# Patient Record
Sex: Female | Born: 1960 | Race: White | Hispanic: No | Marital: Married | Smoking: Never smoker
Health system: Southern US, Community
[De-identification: ages and names within clinical notes are randomized; demographics above are authoritative.]

## PROBLEM LIST (undated history)

## (undated) DIAGNOSIS — I10 Essential (primary) hypertension: Secondary | ICD-10-CM

## (undated) DIAGNOSIS — B351 Tinea unguium: Secondary | ICD-10-CM

## (undated) DIAGNOSIS — T7840XA Allergy, unspecified, initial encounter: Secondary | ICD-10-CM

## (undated) DIAGNOSIS — E785 Hyperlipidemia, unspecified: Secondary | ICD-10-CM

## (undated) DIAGNOSIS — F419 Anxiety disorder, unspecified: Secondary | ICD-10-CM

## (undated) DIAGNOSIS — E669 Obesity, unspecified: Secondary | ICD-10-CM

## (undated) DIAGNOSIS — G4730 Sleep apnea, unspecified: Secondary | ICD-10-CM

## (undated) DIAGNOSIS — M25579 Pain in unspecified ankle and joints of unspecified foot: Secondary | ICD-10-CM

## (undated) DIAGNOSIS — K219 Gastro-esophageal reflux disease without esophagitis: Secondary | ICD-10-CM

## (undated) DIAGNOSIS — E559 Vitamin D deficiency, unspecified: Secondary | ICD-10-CM

## (undated) DIAGNOSIS — R0602 Shortness of breath: Secondary | ICD-10-CM

## (undated) DIAGNOSIS — M8580 Other specified disorders of bone density and structure, unspecified site: Secondary | ICD-10-CM

## (undated) DIAGNOSIS — M779 Enthesopathy, unspecified: Secondary | ICD-10-CM

## (undated) DIAGNOSIS — H269 Unspecified cataract: Secondary | ICD-10-CM

## (undated) DIAGNOSIS — M1990 Unspecified osteoarthritis, unspecified site: Secondary | ICD-10-CM

## (undated) DIAGNOSIS — M2550 Pain in unspecified joint: Secondary | ICD-10-CM

## (undated) DIAGNOSIS — F32A Depression, unspecified: Secondary | ICD-10-CM

## (undated) DIAGNOSIS — N920 Excessive and frequent menstruation with regular cycle: Secondary | ICD-10-CM

## (undated) DIAGNOSIS — R600 Localized edema: Secondary | ICD-10-CM

## (undated) HISTORY — DX: Tinea unguium: B35.1

## (undated) HISTORY — DX: Vitamin D deficiency, unspecified: E55.9

## (undated) HISTORY — PX: TONSILLECTOMY: SUR1361

## (undated) HISTORY — DX: Depression, unspecified: F32.A

## (undated) HISTORY — DX: Unspecified cataract: H26.9

## (undated) HISTORY — DX: Localized edema: R60.0

## (undated) HISTORY — DX: Allergy, unspecified, initial encounter: T78.40XA

## (undated) HISTORY — PX: APPENDECTOMY: SHX54

## (undated) HISTORY — DX: Essential (primary) hypertension: I10

## (undated) HISTORY — DX: Unspecified osteoarthritis, unspecified site: M19.90

## (undated) HISTORY — DX: Hyperlipidemia, unspecified: E78.5

## (undated) HISTORY — DX: Other specified disorders of bone density and structure, unspecified site: M85.80

## (undated) HISTORY — DX: Gastro-esophageal reflux disease without esophagitis: K21.9

## (undated) HISTORY — DX: Anxiety disorder, unspecified: F41.9

## (undated) HISTORY — DX: Pain in unspecified ankle and joints of unspecified foot: M25.579

## (undated) HISTORY — DX: Obesity, unspecified: E66.9

## (undated) HISTORY — DX: Shortness of breath: R06.02

## (undated) HISTORY — DX: Enthesopathy, unspecified: M77.9

## (undated) HISTORY — DX: Excessive and frequent menstruation with regular cycle: N92.0

## (undated) HISTORY — DX: Sleep apnea, unspecified: G47.30

## (undated) HISTORY — DX: Pain in unspecified joint: M25.50

---

## 1998-01-23 ENCOUNTER — Other Ambulatory Visit: Admission: RE | Admit: 1998-01-23 | Discharge: 1998-01-23 | Payer: Self-pay | Admitting: Dermatology

## 1998-07-21 ENCOUNTER — Other Ambulatory Visit: Admission: RE | Admit: 1998-07-21 | Discharge: 1998-07-21 | Payer: Self-pay | Admitting: Obstetrics and Gynecology

## 1999-09-27 ENCOUNTER — Other Ambulatory Visit: Admission: RE | Admit: 1999-09-27 | Discharge: 1999-09-27 | Payer: Self-pay | Admitting: Obstetrics and Gynecology

## 2000-12-21 ENCOUNTER — Other Ambulatory Visit: Admission: RE | Admit: 2000-12-21 | Discharge: 2000-12-21 | Payer: Self-pay | Admitting: Obstetrics and Gynecology

## 2002-03-19 ENCOUNTER — Other Ambulatory Visit: Admission: RE | Admit: 2002-03-19 | Discharge: 2002-03-19 | Payer: Self-pay | Admitting: Obstetrics and Gynecology

## 2003-03-20 DIAGNOSIS — E782 Mixed hyperlipidemia: Secondary | ICD-10-CM | POA: Insufficient documentation

## 2003-04-16 ENCOUNTER — Other Ambulatory Visit: Admission: RE | Admit: 2003-04-16 | Discharge: 2003-04-16 | Payer: Self-pay | Admitting: Obstetrics and Gynecology

## 2005-01-21 ENCOUNTER — Encounter: Admission: RE | Admit: 2005-01-21 | Discharge: 2005-01-21 | Payer: Self-pay | Admitting: Obstetrics and Gynecology

## 2006-03-13 ENCOUNTER — Encounter: Admission: RE | Admit: 2006-03-13 | Discharge: 2006-03-13 | Payer: Self-pay | Admitting: Obstetrics and Gynecology

## 2007-04-02 ENCOUNTER — Encounter: Admission: RE | Admit: 2007-04-02 | Discharge: 2007-04-02 | Payer: Self-pay | Admitting: Obstetrics and Gynecology

## 2008-04-25 ENCOUNTER — Encounter: Admission: RE | Admit: 2008-04-25 | Discharge: 2008-04-25 | Payer: Self-pay | Admitting: Obstetrics and Gynecology

## 2009-09-19 LAB — HM DEXA SCAN

## 2010-09-19 HISTORY — PX: COLONOSCOPY: SHX174

## 2010-10-10 ENCOUNTER — Encounter: Payer: Self-pay | Admitting: Obstetrics and Gynecology

## 2010-10-11 ENCOUNTER — Encounter: Payer: Self-pay | Admitting: Obstetrics and Gynecology

## 2010-11-29 ENCOUNTER — Encounter (INDEPENDENT_AMBULATORY_CARE_PROVIDER_SITE_OTHER): Payer: Self-pay | Admitting: *Deleted

## 2010-12-07 NOTE — Letter (Signed)
Summary: Pre Visit Letter Revised  [facility]  [address] [place], [place] [address]   Phone: [phone]  Fax: [phone]        11/29/2010 MRN: [id number] [facility] [address] [place], [place]  [address]             Procedure Date:  December 29, 2010   dir col -Dr Jarold Motto   Welcome to the Gastroenterology Division at [facility].    You are scheduled to see a nurse for your pre-procedure visit on December 15, 2010 at 4:00pm on the 3rd floor at [facility], [address]  We ask that you try to arrive at our office 15 minutes prior to your appointment time to allow for check-in.  Please take a minute to review the attached form.  If you answer "Yes" to one or more of the questions on the first page, we ask that you call the person listed at your earliest opportunity.  If you answer "No" to all of the questions, please complete the rest of the form and bring it to your appointment.    Your nurse visit will consist of discussing your medical and surgical history, your immediate family medical history, and your medications.   If you are unable to list all of your medications on the form, please bring the medication bottles to your appointment and we will list them.  We will need to be aware of both prescribed and over the counter drugs.  We will need to know exact dosage information as well.    Please be prepared to read and sign documents such as consent forms, a financial agreement, and acknowledgement forms.  If necessary, and with your consent, a friend or relative is welcome to sit-in on the nurse visit with you.  Please bring your insurance card so that we may make a copy of it.  If your insurance requires a referral to see a specialist, please bring your referral form from your primary care physician.  No co-pay is required for this nurse visit.     If you cannot keep your appointment, please call [phone] to cancel or reschedule prior  to your appointment date.  This allows [place] the opportunity to schedule an appointment for another patient in need of care.    Thank you for choosing [facility] for your medical needs.  We appreciate the opportunity to care for you.  Please visit [place] at our website  to learn more about our practice.  Sincerely, The Gastroenterology Division

## 2010-12-29 ENCOUNTER — Other Ambulatory Visit: Payer: Self-pay | Admitting: Gastroenterology

## 2012-12-21 ENCOUNTER — Other Ambulatory Visit: Payer: Self-pay | Admitting: Family Medicine

## 2012-12-21 NOTE — Telephone Encounter (Signed)
Denied! This was given to the patient on 06/21/13 with 11 refills. PG

## 2012-12-22 ENCOUNTER — Other Ambulatory Visit: Payer: Self-pay | Admitting: Family Medicine

## 2013-04-16 ENCOUNTER — Encounter: Payer: Self-pay | Admitting: Family Medicine

## 2013-04-16 ENCOUNTER — Ambulatory Visit (INDEPENDENT_AMBULATORY_CARE_PROVIDER_SITE_OTHER): Payer: BC Managed Care – PPO | Admitting: Family Medicine

## 2013-04-16 VITALS — BP 124/84 | HR 83 | Resp 16 | Ht 64.0 in | Wt 222.0 lb

## 2013-04-16 DIAGNOSIS — R232 Flushing: Secondary | ICD-10-CM

## 2013-04-16 DIAGNOSIS — N924 Excessive bleeding in the premenopausal period: Secondary | ICD-10-CM

## 2013-04-16 MED ORDER — NORETHINDRONE 0.35 MG PO TABS: 1.0000 | ORAL_TABLET | Freq: Every day | ORAL | Status: DC

## 2013-04-16 MED ORDER — NORETHIN ACE-ETH ESTRAD-FE 1-20 MG-MCG(24) PO CHEW: 1.0000 | CHEWABLE_TABLET | Freq: Every day | ORAL | Status: DC

## 2013-04-16 NOTE — Progress Notes (Signed)
  Subjective:    Patient ID: Emily Stephens, female    DOB: 17-Apr-1961, 52 y.o.   MRN: [id number]  HPI  Emily Stephens is here today to discuss her menstrual cycle.  She has been experiencing heavy bleeding during her menstrual cycle for the past year.  This problem seems to change from month to month.  She has seen Dr.  Arelia Sneddon (OB/GYN) for this problem.  She was given birth control pills which really has not improved her bleeding.  Review of Systems  Constitutional: Positive for fatigue and unexpected weight change.  Respiratory: Negative for shortness of breath.   Cardiovascular: Negative for chest pain, palpitations and leg swelling.  Genitourinary: Positive for menstrual problem.  Hematological: Does not bruise/bleed easily.    Past Medical History  Diagnosis Date  . Hyperlipidemia   . Obesity   . Osteopenia   . Menorrhagia   . Allergy   . Bone spur     Right Foot  . Other and unspecified hyperlipidemia   . Essential hypertension, benign   . Unspecified vitamin D deficiency   . Pain in joint, ankle and foot   . Dermatophytosis of nail     Family History  Problem Relation Age of Onset  . Lung disease Mother   . Osteoporosis Mother   . CVA Father   . Aneurysm Sister   . Heart disease Brother   . Diabetes Maternal Aunt   . Cancer Maternal Aunt     Breast Cancer   . 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 Constitutional: She is oriented to person, place, and time.  Eyes: Conjunctivae are normal. No  scleral icterus.  Neck: Neck supple. No thyromegaly present.  Cardiovascular: Normal rate, regular rhythm and normal heart sounds.   Pulmonary/Chest: Effort normal and breath sounds normal.  Musculoskeletal: She exhibits no edema and no tenderness.  Lymphadenopathy:    She has no cervical adenopathy.  Neurological: She is alert and oriented to person, place, and time.  Skin: Skin is warm and dry.  Psychiatric: She has a normal mood and affect. Her behavior is normal. Judgment and thought content normal.          Assessment & Plan:

## 2013-04-16 NOTE — Patient Instructions (Addendum)
1)  Excessive Bleeding - Try the Micronor as we discussed.  If this does not accomplish what we want then try the Minastrin.    2)  Night Sweats/Flushing - If this worsens then you an start with an OTC supplement called Estrovan.    Perimenopause Perimenopause is the time when your body begins to move into the menopause (no menstrual period for 12 straight months). It is a natural process. Perimenopause can begin 2 to 8 years before the menopause and usually lasts for one year after the menopause. During this time, your ovaries may or may not produce an egg. The ovaries vary in their production of estrogen and progesterone hormones each month. This can cause irregular menstrual periods, difficulty in getting pregnant, vaginal bleeding between periods and uncomfortable symptoms. CAUSES  Irregular production of the ovarian hormones, estrogen and progesterone, and not ovulating every month.  Other causes include:  Tumor of the pituitary gland in the brain.  Medical disease that affects the ovaries.  Radiation treatment.  Chemotherapy.  Unknown causes.  Heavy smoking and excessive alcohol intake can bring on perimenopause sooner. SYMPTOMS   Hot flashes.  Night sweats.  Irregular menstrual periods.  Decrease sex drive.  Vaginal dryness.  Headaches.  Mood swings.  Depression.  Memory problems.  Irritability.  Tiredness.  Weight gain.  Trouble getting pregnant.  The beginning of losing bone cells (osteoporosis).  The beginning of hardening of the arteries (atherosclerosis). DIAGNOSIS  Your caregiver will make a diagnosis by analyzing your age, menstrual history and your symptoms. They will do a physical exam noting any changes in your body, especially your female organs. Female hormone tests may or may not be helpful depending on the amount and when you produce the female hormones. However, other hormone tests may be helpful (ex. thyroid hormone) to rule out other  problems. TREATMENT  The decision to treat during the perimenopause should be made by you and your caregiver depending on how the symptoms are affecting you and your life style. There are various treatments available such as:  Treating individual symptoms with a specific medication for that symptom (ex. tranquilizer for depression).  Herbal medications that can help specific symptoms.  Counseling.  Group therapy.  No treatment. HOME CARE INSTRUCTIONS   Before seeing your caregiver, make a list of your menstrual periods (when the occur, how heavy they are, how long between periods and how long they last), your symptoms and when they started.  Take the medication as recommended by your caregiver.  Sleep and rest.  Exercise.  Eat a diet that contains calcium (good for your bones) and soy (acts like estrogen hormone).  Do not smoke.  Avoid alcoholic beverages.  Taking vitamin E may help in certain cases.  Take calcium and vitamin D supplements to help prevent bone loss.  Group therapy is sometimes helpful.  Acupuncture may help in some cases. SEEK MEDICAL CARE IF:   You have any of the above and want to know if it is perimenopause.  You want advice and treatment for any of your symptoms mentioned above.  You need a referral to a specialist (gynecologist, psychiatrist or psychologist). SEEK IMMEDIATE MEDICAL CARE IF:   You have vaginal bleeding.  Your period lasts longer than 8 days.  You periods are recurring sooner than 21 days.  You have bleeding after intercourse.  You have severe depression.  You have pain when you urinate.  You have severe headaches.  You develop vision problems. Document Released: 10/13/2004  Document Revised: 11/28/2011 Document Reviewed: 07/03/2008 [facility] Patient Information 2014 [place], [place].

## 2013-05-26 ENCOUNTER — Encounter: Payer: Self-pay | Admitting: Family Medicine

## 2013-05-26 DIAGNOSIS — N924 Excessive bleeding in the premenopausal period: Secondary | ICD-10-CM | POA: Insufficient documentation

## 2013-05-26 DIAGNOSIS — R232 Flushing: Secondary | ICD-10-CM | POA: Insufficient documentation

## 2013-05-26 NOTE — Assessment & Plan Note (Signed)
She is going to try Micronor vs Minastrin.

## 2013-05-26 NOTE — Assessment & Plan Note (Signed)
She is to try some Estrovan for her hot flashes.

## 2013-07-25 ENCOUNTER — Other Ambulatory Visit: Payer: Self-pay

## 2013-10-14 ENCOUNTER — Other Ambulatory Visit: Payer: Self-pay | Admitting: Family Medicine

## 2013-11-11 ENCOUNTER — Ambulatory Visit (INDEPENDENT_AMBULATORY_CARE_PROVIDER_SITE_OTHER): Payer: BC Managed Care – PPO | Admitting: Family Medicine

## 2013-11-11 ENCOUNTER — Encounter: Payer: Self-pay | Admitting: Family Medicine

## 2013-11-11 VITALS — BP 143/83 | HR 96 | Resp 16 | Ht 62.0 in | Wt 229.0 lb

## 2013-11-11 DIAGNOSIS — M5430 Sciatica, unspecified side: Secondary | ICD-10-CM

## 2013-11-11 DIAGNOSIS — M545 Low back pain, unspecified: Secondary | ICD-10-CM

## 2013-11-11 DIAGNOSIS — M5432 Sciatica, left side: Secondary | ICD-10-CM

## 2013-11-11 DIAGNOSIS — R12 Heartburn: Secondary | ICD-10-CM

## 2013-11-11 LAB — POCT URINALYSIS DIPSTICK
Bilirubin, UA: NEGATIVE
Blood, UA: NEGATIVE
Glucose, UA: NEGATIVE
Ketones, UA: NEGATIVE
Leukocytes, UA: NEGATIVE
Nitrite, UA: NEGATIVE
Protein, UA: NEGATIVE
Spec Grav, UA: >=1.03
Urobilinogen, UA: NEGATIVE
pH, UA: 5

## 2013-11-11 MED ORDER — PANTOPRAZOLE SODIUM 40 MG PO TBEC: 40.0000 mg | DELAYED_RELEASE_TABLET | Freq: Every day | ORAL | Status: DC

## 2013-11-11 MED ORDER — GABAPENTIN 300 MG PO CAPS: ORAL_CAPSULE | ORAL | Status: DC

## 2013-11-11 MED ORDER — CYCLOBENZAPRINE HCL 5 MG PO TABS: ORAL_TABLET | ORAL | Status: AC

## 2013-11-11 MED ORDER — METHYLPREDNISOLONE SODIUM SUCC 125 MG IJ SOLR
125.0000 mg | Freq: Once | INTRAMUSCULAR | Status: AC
  Administered 2013-11-11: 125 mg via INTRAMUSCULAR

## 2013-11-11 MED ORDER — TRAMADOL-ACETAMINOPHEN 37.5-325 MG PO TABS: 1.0000 | ORAL_TABLET | Freq: Four times a day (QID) | ORAL | Status: DC | PRN

## 2013-11-11 MED ORDER — CELECOXIB 200 MG PO CAPS: 200.0000 mg | ORAL_CAPSULE | Freq: Two times a day (BID) | ORAL | Status: AC

## 2013-11-11 NOTE — Patient Instructions (Signed)
1)  Back Pain - You received a steroid injection at today's visit.  Take Celebrex 1-2 capsules daily for inflammation/pain along with Tylenol 1000 mg and 1-2 of the Flexeril (muscle relaxer) at night.  At this point if you aren't better consider a chiropractic adjustment.  You may also add some Neurontin 300 mg - Start with 1 at night and slowly increase to 2 per day then 3 per day if needed. You can also add some Ultracet 1-2 at night and increase to 2 x per day if needed.  If you take the Ultracet, remember to decrease your Tylenol because Ultracet has some Tylenol (max Tylenol is 3000 mg per day).  Hot bath with epsom salt and back exercises.      Sciatica with Rehab The sciatic nerve runs from the back down the leg and is responsible for sensation and control of the muscles in the back (posterior) side of the thigh, lower leg, and foot. Sciatica is a condition that is characterized by inflammation of this nerve.  SYMPTOMS   Signs of nerve damage, including numbness and/or weakness along the posterior side of the lower extremity.  Pain in the back of the thigh that may also travel down the leg.  Pain that worsens when sitting for long periods of time.  Occasionally, pain in the back or buttock. CAUSES  Inflammation of the sciatic nerve is the cause of sciatica. The inflammation is due to something irritating the nerve. Common sources of irritation include:  Sitting for long periods of time.  Direct trauma to the nerve.  Arthritis of the spine.  Herniated or ruptured disk.  Slipping of the vertebrae (spondylolithesis)  Pressure from soft tissues, such as muscles or ligament-like tissue (fascia). RISK INCREASES WITH:  Sports that place pressure or stress on the spine (football or weightlifting).  Poor strength and flexibility.  Failure to warm-up properly before activity.  Family history of low back pain or disk disorders.  Previous back injury or surgery.  Poor body  mechanics, especially when lifting, or poor posture. PREVENTION   Warm up and stretch properly before activity.  Maintain physical fitness:  Strength, flexibility, and endurance.  Cardiovascular fitness.  Learn and use proper technique, especially with posture and lifting. When possible, have coach correct improper technique.  Avoid activities that place stress on the spine. PROGNOSIS If treated properly, then sciatica usually resolves within 6 weeks. However, occasionally surgery is necessary.  RELATED COMPLICATIONS   Permanent nerve damage, including pain, numbness, tingle, or weakness.  Chronic back pain.  Risks of surgery: infection, bleeding, nerve damage, or damage to surrounding tissues. TREATMENT Treatment initially involves resting from any activities that aggravate your symptoms. The use of ice and medication may help reduce pain and inflammation. The use of strengthening and stretching exercises may help reduce pain with activity. These exercises may be performed at home or with referral to a therapist. A therapist may recommend further treatments, such as transcutaneous electronic nerve stimulation (TENS) or ultrasound. Your caregiver may recommend corticosteroid injections to help reduce inflammation of the sciatic nerve. If symptoms persist despite non-surgical (conservative) treatment, then surgery may be recommended. MEDICATION  If pain medication is necessary, then nonsteroidal anti-inflammatory medications, such as aspirin and ibuprofen, or other minor pain relievers, such as acetaminophen, are often recommended.  Do not take pain medication for 7 days before surgery.  Prescription pain relievers may be given if deemed necessary by your caregiver. Use only as directed and only as much as  you need.  Ointments applied to the skin may be helpful.  Corticosteroid injections may be given by your caregiver. These injections should be reserved for the most serious cases,  because they may only be given a certain number of times. HEAT AND COLD  Cold treatment (icing) relieves pain and reduces inflammation. Cold treatment should be applied for 10 to 15 minutes every 2 to 3 hours for inflammation and pain and immediately after any activity that aggravates your symptoms. Use ice packs or massage the area with a piece of ice (ice massage).  Heat treatment may be used prior to performing the stretching and strengthening activities prescribed by your caregiver, physical therapist, or athletic trainer. Use a heat pack or soak the injury in warm water. SEEK MEDICAL CARE IF:  Treatment seems to offer no benefit, or the condition worsens.  Any medications produce adverse side effects. EXERCISES  RANGE OF MOTION (ROM) AND STRETCHING EXERCISES - Sciatica Most people with sciatic will find that their symptoms worsen with either excessive bending forward (flexion) or arching at the low back (extension). The exercises which will help resolve your symptoms will focus on the opposite motion. Your physician, physical therapist or athletic trainer will help you determine which exercises will be most helpful to resolve your low back pain. Do not complete any exercises without first consulting with your clinician. Discontinue any exercises which worsen your symptoms until you speak to your clinician. If you have pain, numbness or tingling which travels down into your buttocks, leg or foot, the goal of the therapy is for these symptoms to move closer to your back and eventually resolve. Occasionally, these leg symptoms will get better, but your low back pain may worsen; this is typically an indication of progress in your rehabilitation. Be certain to be very alert to any changes in your symptoms and the activities in which you participated in the 24 hours prior to the change. Sharing this information with your clinician will allow him/her to most efficiently treat your condition. These  exercises may help you when beginning to rehabilitate your injury. Your symptoms may resolve with or without further involvement from your physician, physical therapist or athletic trainer. While completing these exercises, remember:   Restoring tissue flexibility helps normal motion to return to the joints. This allows healthier, less painful movement and activity.  An effective stretch should be held for at least 30 seconds.  A stretch should never be painful. You should only feel a gentle lengthening or release in the stretched tissue. FLEXION RANGE OF MOTION AND STRETCHING EXERCISES: STRETCH  Flexion, Single Knee to Chest   Lie on a firm bed or floor with both legs extended in front of you.  Keeping one leg in contact with the floor, bring your opposite knee to your chest. Hold your leg in place by either grabbing behind your thigh or at your knee.  Pull until you feel a gentle stretch in your low back. Hold __________ seconds.  Slowly release your grasp and repeat the exercise with the opposite side. Repeat __________ times. Complete this exercise __________ times per day.  STRETCH  Flexion, Double Knee to Chest  Lie on a firm bed or floor with both legs extended in front of you.  Keeping one leg in contact with the floor, bring your opposite knee to your chest.  Tense your stomach muscles to support your back and then lift your other knee to your chest. Hold your legs in place by either grabbing  behind your thighs or at your knees.  Pull both knees toward your chest until you feel a gentle stretch in your low back. Hold __________ seconds.  Tense your stomach muscles and slowly return one leg at a time to the floor. Repeat __________ times. Complete this exercise __________ times per day.  STRETCH  Low Trunk Rotation   Lie on a firm bed or floor. Keeping your legs in front of you, bend your knees so they are both pointed toward the ceiling and your feet are flat on the  floor.  Extend your arms out to the side. This will stabilize your upper body by keeping your shoulders in contact with the floor.  Gently and slowly drop both knees together to one side until you feel a gentle stretch in your low back. Hold for __________ seconds.  Tense your stomach muscles to support your low back as you bring your knees back to the starting position. Repeat the exercise to the other side. Repeat __________ times. Complete this exercise __________ times per day  EXTENSION RANGE OF MOTION AND FLEXIBILITY EXERCISES: STRETCH  Extension, Prone on Elbows  Lie on your stomach on the floor, a bed will be too soft. Place your palms about shoulder width apart and at the height of your head.  Place your elbows under your shoulders. If this is too painful, stack pillows under your chest.  Allow your body to relax so that your hips drop lower and make contact more completely with the floor.  Hold this position for __________ seconds.  Slowly return to lying flat on the floor. Repeat __________ times. Complete this exercise __________ times per day.  RANGE OF MOTION  Extension, Prone Press Ups  Lie on your stomach on the floor, a bed will be too soft. Place your palms about shoulder width apart and at the height of your head.  Keeping your back as relaxed as possible, slowly straighten your elbows while keeping your hips on the floor. You may adjust the placement of your hands to maximize your comfort. As you gain motion, your hands will come more underneath your shoulders.  Hold this position __________ seconds.  Slowly return to lying flat on the floor. Repeat __________ times. Complete this exercise __________ times per day.  STRENGTHENING EXERCISES - Sciatica  These exercises may help you when beginning to rehabilitate your injury. These exercises should be done near your "sweet spot." This is the neutral, low-back arch, somewhere between fully rounded and fully arched,  that is your least painful position. When performed in this safe range of motion, these exercises can be used for people who have either a flexion or extension based injury. These exercises may resolve your symptoms with or without further involvement from your physician, physical therapist or athletic trainer. While completing these exercises, remember:   Muscles can gain both the endurance and the strength needed for everyday activities through controlled exercises.  Complete these exercises as instructed by your physician, physical therapist or athletic trainer. Progress with the resistance and repetition exercises only as your caregiver advises.  You may experience muscle soreness or fatigue, but the pain or discomfort you are trying to eliminate should never worsen during these exercises. If this pain does worsen, stop and make certain you are following the directions exactly. If the pain is still present after adjustments, discontinue the exercise until you can discuss the trouble with your clinician. STRENGTHENING Deep Abdominals, Pelvic Tilt   Lie on a firm bed or  floor. Keeping your legs in front of you, bend your knees so they are both pointed toward the ceiling and your feet are flat on the floor.  Tense your lower abdominal muscles to press your low back into the floor. This motion will rotate your pelvis so that your tail bone is scooping upwards rather than pointing at your feet or into the floor.  With a gentle tension and even breathing, hold this position for __________ seconds. Repeat __________ times. Complete this exercise __________ times per day.  STRENGTHENING  Abdominals, Crunches   Lie on a firm bed or floor. Keeping your legs in front of you, bend your knees so they are both pointed toward the ceiling and your feet are flat on the floor. Cross your arms over your chest.  Slightly tip your chin down without bending your neck.  Tense your abdominals and slowly lift your  trunk high enough to just clear your shoulder blades. Lifting higher can put excessive stress on the low back and does not further strengthen your abdominal muscles.  Control your return to the starting position. Repeat __________ times. Complete this exercise __________ times per day.  STRENGTHENING  Quadruped, Opposite UE/LE Lift  Assume a hands and knees position on a firm surface. Keep your hands under your shoulders and your knees under your hips. You may place padding under your knees for comfort.  Find your neutral spine and gently tense your abdominal muscles so that you can maintain this position. Your shoulders and hips should form a rectangle that is parallel with the floor and is not twisted.  Keeping your trunk steady, lift your right hand no higher than your shoulder and then your left leg no higher than your hip. Make sure you are not holding your breath. Hold this position __________ seconds.  Continuing to keep your abdominal muscles tense and your back steady, slowly return to your starting position. Repeat with the opposite arm and leg. Repeat __________ times. Complete this exercise __________ times per day.  STRENGTHENING  Abdominals and Quadriceps, Straight Leg Raise   Lie on a firm bed or floor with both legs extended in front of you.  Keeping one leg in contact with the floor, bend the other knee so that your foot can rest flat on the floor.  Find your neutral spine, and tense your abdominal muscles to maintain your spinal position throughout the exercise.  Slowly lift your straight leg off the floor about 6 inches for a count of 15, making sure to not hold your breath.  Still keeping your neutral spine, slowly lower your leg all the way to the floor. Repeat this exercise with each leg __________ times. Complete this exercise __________ times per day. POSTURE AND BODY MECHANICS CONSIDERATIONS - Sciatica Keeping correct posture when sitting, standing or completing  your activities will reduce the stress put on different body tissues, allowing injured tissues a chance to heal and limiting painful experiences. The following are general guidelines for improved posture. Your physician or physical therapist will provide you with any instructions specific to your needs. While reading these guidelines, remember:  The exercises prescribed by your provider will help you have the flexibility and strength to maintain correct postures.  The correct posture provides the optimal environment for your joints to work. All of your joints have less wear and tear when properly supported by a spine with good posture. This means you will experience a healthier, less painful body.  Correct posture must be practiced  with all of your activities, especially prolonged sitting and standing. Correct posture is as important when doing repetitive low-stress activities (typing) as it is when doing a single heavy-load activity (lifting). RESTING POSITIONS Consider which positions are most painful for you when choosing a resting position. If you have pain with flexion-based activities (sitting, bending, stooping, squatting), choose a position that allows you to rest in a less flexed posture. You would want to avoid curling into a fetal position on your side. If your pain worsens with extension-based activities (prolonged standing, working overhead), avoid resting in an extended position such as sleeping on your stomach. Most people will find more comfort when they rest with their spine in a more neutral position, neither too rounded nor too arched. Lying on a non-sagging bed on your side with a pillow between your knees, or on your back with a pillow under your knees will often provide some relief. Keep in mind, being in any one position for a prolonged period of time, no matter how correct your posture, can still lead to stiffness. PROPER SITTING POSTURE In order to minimize stress and discomfort on  your spine, you must sit with correct posture Sitting with good posture should be effortless for a healthy body. Returning to good posture is a gradual process. Many people can work toward this most comfortably by using various supports until they have the flexibility and strength to maintain this posture on their own. When sitting with proper posture, your ears will fall over your shoulders and your shoulders will fall over your hips. You should use the back of the chair to support your upper back. Your low back will be in a neutral position, just slightly arched. You may place a small pillow or folded towel at the base of your low back for support.  When working at a desk, create an environment that supports good, upright posture. Without extra support, muscles fatigue and lead to excessive strain on joints and other tissues. Keep these recommendations in mind: CHAIR:   A chair should be able to slide under your desk when your back makes contact with the back of the chair. This allows you to work closely.  The chair's height should allow your eyes to be level with the upper part of your monitor and your hands to be slightly lower than your elbows. BODY POSITION  Your feet should make contact with the floor. If this is not possible, use a foot rest.  Keep your ears over your shoulders. This will reduce stress on your neck and low back. INCORRECT SITTING POSTURES   If you are feeling tired and unable to assume a healthy sitting posture, do not slouch or slump. This puts excessive strain on your back tissues, causing more damage and pain. Healthier options include:  Using more support, like a lumbar pillow.  Switching tasks to something that requires you to be upright or walking.  Talking a brief walk.  Lying down to rest in a neutral-spine position. PROLONGED STANDING WHILE SLIGHTLY LEANING FORWARD  When completing a task that requires you to lean forward while standing in one place for a  long time, place either foot up on a stationary 2-4 inch high object to help maintain the best posture. When both feet are on the ground, the low back tends to lose its slight inward curve. If this curve flattens (or becomes too large), then the back and your other joints will experience too much stress, fatigue more quickly and can  cause pain.  CORRECT STANDING POSTURES Proper standing posture should be assumed with all daily activities, even if they only take a few moments, like when brushing your teeth. As in sitting, your ears should fall over your shoulders and your shoulders should fall over your hips. You should keep a slight tension in your abdominal muscles to brace your spine. Your tailbone should point down to the ground, not behind your body, resulting in an over-extended swayback posture.  INCORRECT STANDING POSTURES  Common incorrect standing postures include a forward head, locked knees and/or an excessive swayback. WALKING Walk with an upright posture. Your ears, shoulders and hips should all line-up. PROLONGED ACTIVITY IN A FLEXED POSITION When completing a task that requires you to bend forward at your waist or lean over a low surface, try to find a way to stabilize 3 of 4 of your limbs. You can place a hand or elbow on your thigh or rest a knee on the surface you are reaching across. This will provide you more stability so that your muscles do not fatigue as quickly. By keeping your knees relaxed, or slightly bent, you will also reduce stress across your low back. CORRECT LIFTING TECHNIQUES DO :   Assume a wide stance. This will provide you more stability and the opportunity to get as close as possible to the object which you are lifting.  Tense your abdominals to brace your spine; then bend at the knees and hips. Keeping your back locked in a neutral-spine position, lift using your leg muscles. Lift with your legs, keeping your back straight.  Test the weight of unknown objects  before attempting to lift them.  Try to keep your elbows locked down at your sides in order get the best strength from your shoulders when carrying an object.  Always ask for help when lifting heavy or awkward objects. INCORRECT LIFTING TECHNIQUES DO NOT:   Lock your knees when lifting, even if it is a small object.  Bend and twist. Pivot at your feet or move your feet when needing to change directions.  Assume that you cannot safely pick up a paperclip without proper posture. Document Released: 09/05/2005 Document Revised: 11/28/2011 Document Reviewed: 12/18/2008 [facility] Patient Information 2014 [place], [place].

## 2013-11-11 NOTE — Progress Notes (Signed)
Subjective:    Patient ID: Emily Stephens, female    DOB: 04/20/1961, 53 y.o.   MRN: [id number]  HPI  Chera is here today complaining of left hip and lower back pain. She says that her pain first started in left hip and over time it has moved to her lower back. She has been having this pain for several months. She has been taking Ibuprofen for her pain which helps her to get to sleep at night when the pain is the worse.  She is also complaining of heart burn. She has tried OTC Prilosec and Tums which have not helped.    Review of Systems  Constitutional: Negative for activity change, appetite change and unexpected weight change.  Cardiovascular: Negative for chest pain, palpitations and leg swelling.  Gastrointestinal:       Heart burn  Endocrine: Negative for polyuria.  Genitourinary: Negative for dysuria.  Musculoskeletal: Positive for back pain.  All other systems reviewed and are negative.    Past Medical History  Diagnosis Date  . Hyperlipidemia   . Obesity   . Osteopenia   . Menorrhagia   . Allergy   . Bone spur     Right Foot  . Other and unspecified hyperlipidemia   . Essential hypertension, benign   . Unspecified vitamin D deficiency   . Pain in joint, ankle and foot   . Dermatophytosis of nail      Past Surgical History  Procedure Laterality Date  . Appendectomy    . 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 Lung disease Mother   . Osteoporosis Mother   . CVA Father   . Aneurysm Sister     . Heart disease Brother   . Diabetes Maternal Aunt   . Cancer Maternal Aunt     Breast Cancer   . Diabetes Maternal Uncle      Current Outpatient Prescriptions on File Prior to Visit  Medication Sig Dispense Refill  . Norethin Ace-Eth Estrad-FE (MINASTRIN 24 FE) 1-20 MG-MCG(24) CHEW Chew 1 tablet by mouth daily.  28 tablet  11  . rosuvastatin (CRESTOR) 20 MG tablet Take 20 mg by mouth daily.       No current facility-administered medications on file prior to visit.     No Known Allergies   Immunization History  Administered Date(s) Administered  . Tdap 01/01/2008  . Zoster 06/21/2012       Objective:   Physical Exam  Vitals reviewed. Constitutional: She is oriented to person, place, and time.  Eyes: Conjunctivae are normal. No scleral icterus.  Neck: Neck supple. No thyromegaly present.  Cardiovascular: Normal rate, regular rhythm and normal heart sounds.   Pulmonary/Chest: Effort normal and breath sounds normal.  Musculoskeletal: She exhibits no edema and no tenderness.  Pain in lower back  Lymphadenopathy:    She has no cervical adenopathy.  Neurological: She is alert and oriented to person, place, and time.  Skin: Skin is warm and dry.  Psychiatric: She has a normal mood and affect. Her behavior is normal. Judgment and thought content normal.      Assessment & Plan:    Havana was seen today for back pain.  Diagnoses and associated orders for this visit:  Heart burn - pantoprazole (PROTONIX) 40 MG tablet; Take 1 tablet (40 mg total) by mouth daily.  Lower back pain - POCT urinalysis dipstick - celecoxib (CELEBREX) 200 MG capsule; Take 1 capsule (200 mg total) by mouth 2 (two) times daily. - cyclobenzaprine (FLEXERIL) 5 MG tablet; Take 1-2 tabs at night for muscle spasm - traMADol-acetaminophen (ULTRACET) 37.5-325 MG per tablet; Take 1-2 tablets by mouth every 6 (six) hours as needed. - gabapentin (NEURONTIN) 300 MG capsule; Take 1 capsule up to tid for  pain - methylPREDNISolone sodium succinate (SOLU-MEDROL) 125 mg/2 mL injection 125 mg; Inject 2 mLs (125 mg total) into the muscle once.  Sciatica of left side - celecoxib (CELEBREX) 200 MG capsule; Take 1 capsule (200 mg total) by mouth 2 (two) times daily. - cyclobenzaprine (FLEXERIL) 5 MG tablet; Take 1-2 tabs at night for muscle spasm - traMADol-acetaminophen (ULTRACET) 37.5-325 MG per tablet; Take 1-2 tablets by mouth every 6 (six) hours as needed. - gabapentin (NEURONTIN) 300 MG capsule; Take 1 capsule up to tid for pain - methylPREDNISolone sodium succinate (SOLU-MEDROL) 125 mg/2 mL injection 125 mg; Inject 2 mLs (125 mg total) into the muscle once.   TIME SPENT "FACE TO FACE" WITH PATIENT -  30 MINS

## 2013-11-15 ENCOUNTER — Other Ambulatory Visit: Payer: Self-pay | Admitting: Family Medicine

## 2013-12-17 ENCOUNTER — Encounter: Payer: Self-pay | Admitting: Family Medicine

## 2013-12-17 ENCOUNTER — Ambulatory Visit (INDEPENDENT_AMBULATORY_CARE_PROVIDER_SITE_OTHER): Payer: BC Managed Care – PPO | Admitting: Family Medicine

## 2013-12-17 VITALS — BP 126/81 | HR 81 | Wt 232.0 lb

## 2013-12-17 DIAGNOSIS — M545 Low back pain, unspecified: Secondary | ICD-10-CM | POA: Insufficient documentation

## 2013-12-17 DIAGNOSIS — M2550 Pain in unspecified joint: Secondary | ICD-10-CM

## 2013-12-17 DIAGNOSIS — Z5181 Encounter for therapeutic drug level monitoring: Secondary | ICD-10-CM

## 2013-12-17 DIAGNOSIS — R12 Heartburn: Secondary | ICD-10-CM | POA: Insufficient documentation

## 2013-12-17 DIAGNOSIS — E559 Vitamin D deficiency, unspecified: Secondary | ICD-10-CM

## 2013-12-17 DIAGNOSIS — E785 Hyperlipidemia, unspecified: Secondary | ICD-10-CM

## 2013-12-17 DIAGNOSIS — R5381 Other malaise: Secondary | ICD-10-CM

## 2013-12-17 DIAGNOSIS — IMO0001 Reserved for inherently not codable concepts without codable children: Secondary | ICD-10-CM

## 2013-12-17 DIAGNOSIS — M5432 Sciatica, left side: Secondary | ICD-10-CM | POA: Insufficient documentation

## 2013-12-17 DIAGNOSIS — R5383 Other fatigue: Secondary | ICD-10-CM

## 2013-12-17 MED ORDER — MELOXICAM 15 MG PO TABS: ORAL_TABLET | ORAL | Status: AC

## 2013-12-17 NOTE — Patient Instructions (Signed)
1)   Pain/Inflammation - Try some fish oil - Vitacost - Liquid Fish Oil (Lemon Flavor) [employer] - Take 1-3 teaspoons after we see your labs, we might consider some Cymbalta.      Myalgia, Adult Myalgia is the medical term for muscle pain. It is a symptom of many things. Nearly everyone at some time in their life has this. The most common cause for muscle pain is overuse or straining and more so when you are not in shape. Injuries and muscle bruises cause myalgias. Muscle pain without a history of injury can also be caused by a virus. It frequently comes along with the flu. Myalgia not caused by muscle strain can be present in a large number of infectious diseases. Some autoimmune diseases like lupus and fibromyalgia can cause muscle pain. Myalgia may be mild, or severe. SYMPTOMS  The symptoms of myalgia are simply muscle pain. Most of the time this is short lived and the pain goes away without treatment. DIAGNOSIS  Myalgia is diagnosed by your caregiver by taking your history. This means you tell him when the problems began, what they are, and what has been happening. If this has not been a long term problem, your caregiver may want to watch for a while to see what will happen. If it has been long term, they may want to do additional testing. TREATMENT  The treatment depends on what the underlying cause of the muscle pain is. Often anti-inflammatory medications will help. HOME CARE INSTRUCTIONS  If the pain in your muscles came from overuse, slow down your activities until the problems go away.  Myalgia from overuse of a muscle can be treated with alternating hot and cold packs on the muscle affected or with cold for the first couple days. If either heat or cold seems to make things worse, stop their use.  Apply ice to the sore area for 15-20 minutes, 03-04 times per day, while awake for the first 2 days of muscle soreness, or as directed. Put the ice in a plastic bag and place a towel between  the bag of ice and your skin.  Only take over-the-counter or prescription medicines for pain, discomfort, or fever as directed by your caregiver.  Regular gentle exercise may help if you are not active.  Stretching before strenuous exercise can help lower the risk of myalgia. It is normal when beginning an exercise regimen to feel some muscle pain after exercising. Muscles that have not been used frequently will be sore at first. If the pain is extreme, this may mean injury to a muscle. SEEK MEDICAL CARE IF:  You have an increase in muscle pain that is not relieved with medication.  You begin to run a temperature.  You develop nausea and vomiting.  You develop a stiff and painful neck.  You develop a rash.  You develop muscle pain after a tick bite.  You have continued muscle pain while working out even after you are in good condition. SEEK IMMEDIATE MEDICAL CARE IF: Any of your problems are getting worse and medications are not helping. MAKE SURE YOU:   Understand these instructions.  Will watch your condition.  Will get help right away if you are not doing well or get worse. Document Released: 07/28/2006 Document Revised: 11/28/2011 Document Reviewed: 10/17/2006 [facility] Patient Information 2014 [address], [place].

## 2013-12-17 NOTE — Progress Notes (Signed)
Subjective:    Patient ID: Emily Stephens, female    DOB: Apr 12, 1961, 53 y.o.   MRN: [id number]  HPI  Emily Stephens is back to follow up on her back pain.  She has been taking Celebrex and Tramadol which she says usually helps her at night.  She feels that her back  pain is not "muscular" but her leg pain does seem to be muscular in nature.  She tried Flexeril which did not seem to help her.  She also has tried Mobic in the past which did help her.  She wonders if her condition is due to an autoimmune  disease and would like to have labwork to rule this out.     Review of Systems  Constitutional: Negative for activity change, fatigue and unexpected weight change.  HENT: Negative.   Eyes: Negative.   Respiratory: Negative for shortness of breath.   Cardiovascular: Negative for chest pain, palpitations and leg swelling.  Gastrointestinal: Negative for diarrhea and constipation.  Endocrine: Negative.   Genitourinary: Negative for difficulty urinating.  Musculoskeletal: Positive for back pain and myalgias (legs (bilateral)).       Radiated to legs   Skin: Negative.  Negative for rash.  Neurological: Negative.   Hematological: Negative for adenopathy. Does not bruise/bleed easily.  Psychiatric/Behavioral: Negative for sleep disturbance and dysphoric mood. The patient is not nervous/anxious.      Past Medical History  Diagnosis Date  . Hyperlipidemia   . Obesity   . Osteopenia   . Menorrhagia   . Allergy   . Bone spur     Right Foot  . Other and unspecified hyperlipidemia   . Essential hypertension, benign   . Unspecified vitamin D deficiency   . Pain in joint, ankle and foot   . Dermatophytosis of nail      Past Surgical History  Procedure Laterality Date  . Appendectomy    . 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 Lung disease Mother   . Osteoporosis Mother   . CVA Father   . Aneurysm Sister   . Heart disease Brother   . Diabetes Maternal Aunt   . Cancer Maternal Aunt     Breast Cancer   . Diabetes Maternal Uncle      Current Outpatient Prescriptions on File Prior to Visit  Medication Sig Dispense Refill  . celecoxib (CELEBREX) 200 MG capsule Take 1 capsule (200 mg total) by mouth 2 (two) times daily.  60 capsule  11  . pantoprazole (PROTONIX) 40 MG tablet Take 1 tablet (40 mg total) by mouth daily.  30 tablet  11  . rosuvastatin (CRESTOR) 20 MG tablet Take 20 mg by mouth daily.      . traMADol-acetaminophen (ULTRACET) 37.5-325 MG per tablet Take 1-2 tablets by mouth every 6 (six) hours as needed.  60 tablet  2  .  cyclobenzaprine (FLEXERIL) 5 MG tablet Take 1-2 tabs at night for muscle spasm  60 tablet  1   No current facility-administered medications on file prior to visit.     No Known Allergies   Immunization History  Administered Date(s) Administered  . Tdap 01/01/2008  . Zoster 06/21/2012       Objective:   Physical Exam  Vitals reviewed. Constitutional: She is oriented to person, place, and time. She appears well-developed and well-nourished. No distress.  HENT:  Head: Normocephalic.  Eyes: No scleral icterus.  Neck: Neck supple. No JVD present. No thyromegaly present.  Cardiovascular: Normal rate, regular rhythm and normal heart sounds.  Exam reveals no gallop and no friction rub.   No murmur heard. Pulmonary/Chest: Effort normal and breath sounds normal. She has no wheezes. She exhibits no tenderness.  Abdominal: She exhibits no distension. There is no  tenderness.  Musculoskeletal: She exhibits no edema and no tenderness.  Lymphadenopathy:    She has no cervical adenopathy.  Neurological: She is alert and oriented to person, place, and time.  Skin: Skin is warm and dry.  Psychiatric: She has a normal mood and affect. Her behavior is normal. Judgment and thought content normal.      Assessment & Plan:   Emily Stephens was seen today for back pain and myalgia.  Diagnoses and associated orders for this visit:  Pain in joints - Sed Rate (ESR); Future - Rheumatoid Factor; Future - Antinuclear Antib (ANA); Future - Uric acid; Future - C-reactive protein; Future - meloxicam (MOBIC) 15 MG tablet; Take 1/2 - 1 tab po daily  Myalgia and myositis - Antinuclear Antib (ANA); Future - CK (Creatine Kinase); Future - meloxicam (MOBIC) 15 MG tablet; Take 1/2 - 1 tab po daily  Other malaise and fatigue - TSH; Future  Other and unspecified hyperlipidemia - Lipid panel; Future  Unspecified vitamin D deficiency - Vit D  25 hydroxy (rtn osteoporosis monitoring); Future  Encounter for therapeutic drug monitoring - CBC w/Diff; Future - COMPLETE METABOLIC PANEL WITH GFR; Future   TIME SPENT "FACE TO FACE" WITH PATIENT -  30 MINS

## 2013-12-23 ENCOUNTER — Other Ambulatory Visit: Payer: Self-pay | Admitting: *Deleted

## 2013-12-23 DIAGNOSIS — IMO0001 Reserved for inherently not codable concepts without codable children: Secondary | ICD-10-CM

## 2013-12-23 DIAGNOSIS — R5383 Other fatigue: Secondary | ICD-10-CM

## 2013-12-23 DIAGNOSIS — M2550 Pain in unspecified joint: Secondary | ICD-10-CM

## 2013-12-23 DIAGNOSIS — Z5181 Encounter for therapeutic drug level monitoring: Secondary | ICD-10-CM

## 2013-12-23 DIAGNOSIS — E559 Vitamin D deficiency, unspecified: Secondary | ICD-10-CM

## 2013-12-23 DIAGNOSIS — R5381 Other malaise: Secondary | ICD-10-CM

## 2013-12-23 DIAGNOSIS — E785 Hyperlipidemia, unspecified: Secondary | ICD-10-CM

## 2013-12-23 LAB — COMPLETE METABOLIC PANEL WITH GFR
ALT: 20 U/L (ref 0–35)
AST: 15 U/L (ref 0–37)
Albumin: 4.2 g/dL (ref 3.5–5.2)
Alkaline Phosphatase: 73 U/L (ref 39–117)
BUN: 14 mg/dL (ref 6–23)
CO2: 23 mEq/L (ref 19–32)
Calcium: 8.9 mg/dL (ref 8.4–10.5)
Chloride: 102 mEq/L (ref 96–112)
Creat: 0.7 mg/dL (ref 0.50–1.10)
GFR, Est African American: >89 mL/min
GFR, Est Non African American: >89 mL/min
Glucose, Bld: 86 mg/dL (ref 70–99)
Potassium: 4.4 mEq/L (ref 3.5–5.3)
Sodium: 136 mEq/L (ref 135–145)
Total Bilirubin: 0.6 mg/dL (ref 0.2–1.2)
Total Protein: 6.7 g/dL (ref 6.0–8.3)

## 2013-12-23 LAB — CBC WITH DIFFERENTIAL/PLATELET
Basophils Absolute: 0 10*3/uL (ref 0.0–0.1)
Basophils Relative: 0 % (ref 0–1)
Eosinophils Absolute: 0.2 10*3/uL (ref 0.0–0.7)
Eosinophils Relative: 2 % (ref 0–5)
HCT: 43.3 % (ref 36.0–46.0)
Hemoglobin: 15 g/dL (ref 12.0–15.0)
Lymphocytes Relative: 24 % (ref 12–46)
Lymphs Abs: 2 10*3/uL (ref 0.7–4.0)
MCH: 27.3 pg (ref 26.0–34.0)
MCHC: 34.6 g/dL (ref 30.0–36.0)
MCV: 78.7 fL (ref 78.0–100.0)
Monocytes Absolute: 0.7 10*3/uL (ref 0.1–1.0)
Monocytes Relative: 9 % (ref 3–12)
Neutro Abs: 5.3 10*3/uL (ref 1.7–7.7)
Neutrophils Relative %: 65 % (ref 43–77)
Platelets: 221 10*3/uL (ref 150–400)
RBC: 5.5 MIL/uL — ABNORMAL HIGH (ref 3.87–5.11)
RDW: 14 % (ref 11.5–15.5)
WBC: 8.2 10*3/uL (ref 4.0–10.5)

## 2013-12-23 LAB — URIC ACID: Uric Acid, Serum: 5.4 mg/dL (ref 2.4–7.0)

## 2013-12-23 LAB — CK: Total CK: 58 U/L (ref 7–177)

## 2013-12-23 LAB — RHEUMATOID FACTOR: Rhuematoid fact SerPl-aCnc: <10 IU/mL (ref <=14)

## 2013-12-23 LAB — LIPID PANEL
Cholesterol: 144 mg/dL (ref 0–200)
HDL: 39 mg/dL — ABNORMAL LOW (ref >39)
LDL Cholesterol: 83 mg/dL (ref 0–99)
Total CHOL/HDL Ratio: 3.7 Ratio
Triglycerides: 111 mg/dL (ref <150)
VLDL: 22 mg/dL (ref 0–40)

## 2013-12-23 LAB — TSH: TSH: 1.688 u[IU]/mL (ref 0.350–4.500)

## 2013-12-23 LAB — SEDIMENTATION RATE: Sed Rate: 4 mm/hr (ref 0–22)

## 2013-12-23 LAB — C-REACTIVE PROTEIN: CRP: 0.7 mg/dL — ABNORMAL HIGH (ref <0.60)

## 2013-12-24 LAB — VITAMIN D 25 HYDROXY (VIT D DEFICIENCY, FRACTURES): Vit D, 25-Hydroxy: 31 ng/mL (ref 30–89)

## 2013-12-24 LAB — ANA: Anti Nuclear Antibody(ANA): NEGATIVE

## 2014-01-13 ENCOUNTER — Encounter: Payer: Self-pay | Admitting: Family Medicine

## 2014-01-13 ENCOUNTER — Ambulatory Visit (INDEPENDENT_AMBULATORY_CARE_PROVIDER_SITE_OTHER): Payer: BC Managed Care – PPO | Admitting: Family Medicine

## 2014-01-13 VITALS — BP 142/84 | HR 75 | Resp 16 | Wt 232.0 lb

## 2014-01-13 DIAGNOSIS — M25551 Pain in right hip: Secondary | ICD-10-CM

## 2014-01-13 DIAGNOSIS — M545 Low back pain, unspecified: Secondary | ICD-10-CM

## 2014-01-13 DIAGNOSIS — M25552 Pain in left hip: Principal | ICD-10-CM

## 2014-01-13 DIAGNOSIS — M25559 Pain in unspecified hip: Secondary | ICD-10-CM

## 2014-01-13 NOTE — Progress Notes (Signed)
Subjective:    Patient ID: Emily Stephens, female    DOB: May 24, 1961, 53 y.o.   MRN: [id number]  HPI  Emily Stephens is here for a follow up on her back pain.  She says that it has improved but she still has some pain which is worse at night.  She wakes up in the middle of the night with her back and hips hurting.  She has tried Celebrex (200 mg daily) for a month.  She stopped it recently because of the side effects she read about. She prefers the tramadol - acetaminophen for the pain.    Review of Systems  Musculoskeletal: Positive for arthralgias.       Back and Hip Pain   Psychiatric/Behavioral: Positive for sleep disturbance.  All other systems reviewed and are negative.    Past Medical History  Diagnosis Date  . Hyperlipidemia   . Obesity   . Osteopenia   . Menorrhagia   . Allergy   . Bone spur     Right Foot  . Other and unspecified hyperlipidemia   . Essential hypertension, benign   . Unspecified vitamin D deficiency   . Pain in joint, ankle and foot   . Dermatophytosis of nail      Past Surgical History  Procedure Laterality Date  . Appendectomy    . 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 Lung disease Mother   . Osteoporosis Mother   . CVA Father   . Aneurysm Sister   . Heart disease Brother   . Diabetes Maternal Aunt   . Cancer Maternal Aunt     Breast Cancer   . Diabetes Maternal Uncle      Current Outpatient Prescriptions on File Prior to Visit  Medication Sig Dispense Refill  .  pantoprazole (PROTONIX) 40 MG tablet Take 1 tablet (40 mg total) by mouth daily.  30 tablet  11  . celecoxib (CELEBREX) 200 MG capsule Take 1 capsule (200 mg total) by mouth 2 (two) times daily.  60 capsule  11  . cyclobenzaprine (FLEXERIL) 5 MG tablet Take 1-2 tabs at night for muscle spasm  60 tablet  1  . JOLIVETTE 0.35 MG tablet       . meloxicam (MOBIC) 15 MG tablet Take 1/2 - 1 tab po daily  30 tablet  5  . traMADol-acetaminophen (ULTRACET) 37.5-325 MG per tablet Take 1-2 tablets by mouth every 6 (six) hours as needed.  60 tablet  2   No current facility-administered medications on file prior to visit.     No Known Allergies   Immunization History  Administered Date(s) Administered  . Tdap 01/01/2008  . Zoster 06/21/2012       Objective:   Physical Exam  Constitutional: She appears well-nourished. She appears distressed.  Neck: Normal range of motion. Neck supple.  Cardiovascular: Normal rate, regular rhythm  and normal heart sounds.   Pulmonary/Chest: Effort normal and breath sounds normal.  Musculoskeletal: She exhibits tenderness (Low Back Pain). She exhibits no edema.       Lumbar back: She exhibits decreased range of motion, tenderness and spasm. She exhibits no edema and no deformity.  Neurological: She has normal reflexes. She exhibits normal muscle tone. Coordination normal.  Skin: No rash noted.      Assessment & Plan:    Emily Stephens was seen today for back pain.  Diagnoses and associated orders for this visit:  Hip pain, bilateral Comments: X-ray was normal.   - DG Hip Bilateral W/Pelvis;   Bilateral low back pain, with sciatica presence unspecified Comments: X-ray was normal.   - DG Lumbar Spine Complete;

## 2014-01-15 ENCOUNTER — Ambulatory Visit (HOSPITAL_BASED_OUTPATIENT_CLINIC_OR_DEPARTMENT_OTHER)
Admission: RE | Admit: 2014-01-15 | Discharge: 2014-01-15 | Disposition: A | Discharge status: Home / Self Care | Payer: BC Managed Care – PPO | Source: Ambulatory Visit | Attending: Family Medicine | Admitting: Family Medicine

## 2014-01-15 DIAGNOSIS — M545 Low back pain, unspecified: Secondary | ICD-10-CM | POA: Insufficient documentation

## 2014-01-15 DIAGNOSIS — M25552 Pain in left hip: Secondary | ICD-10-CM

## 2014-01-15 DIAGNOSIS — M25551 Pain in right hip: Secondary | ICD-10-CM

## 2014-01-15 DIAGNOSIS — M549 Dorsalgia, unspecified: Secondary | ICD-10-CM

## 2014-01-15 DIAGNOSIS — M25559 Pain in unspecified hip: Secondary | ICD-10-CM | POA: Insufficient documentation

## 2014-01-16 ENCOUNTER — Telehealth: Payer: Self-pay | Admitting: *Deleted

## 2014-01-16 NOTE — Telephone Encounter (Signed)
Emily Stephens is aware that both her x-rays were normal from 01/15/14. -eh

## 2014-02-28 ENCOUNTER — Other Ambulatory Visit: Payer: Self-pay | Admitting: Family Medicine

## 2014-03-03 ENCOUNTER — Other Ambulatory Visit: Payer: Self-pay | Admitting: Family Medicine

## 2014-03-06 ENCOUNTER — Other Ambulatory Visit: Payer: Self-pay | Admitting: *Deleted

## 2014-03-06 MED ORDER — ROSUVASTATIN CALCIUM 20 MG PO TABS: 20.0000 mg | ORAL_TABLET | Freq: Every day | ORAL | Status: DC

## 2014-03-06 NOTE — Telephone Encounter (Signed)
Emily Stephens called to check on her prescription of Crestor, she said she was completely out. Call her at [phone]

## 2014-03-30 DIAGNOSIS — M25551 Pain in right hip: Secondary | ICD-10-CM | POA: Insufficient documentation

## 2014-03-30 DIAGNOSIS — M545 Low back pain, unspecified: Secondary | ICD-10-CM | POA: Insufficient documentation

## 2014-03-30 DIAGNOSIS — M25552 Pain in left hip: Principal | ICD-10-CM

## 2014-06-26 ENCOUNTER — Other Ambulatory Visit: Payer: Self-pay | Admitting: Obstetrics and Gynecology

## 2014-06-26 DIAGNOSIS — R928 Other abnormal and inconclusive findings on diagnostic imaging of breast: Secondary | ICD-10-CM

## 2014-07-02 ENCOUNTER — Ambulatory Visit
Admission: RE | Admit: 2014-07-02 | Discharge: 2014-07-02 | Disposition: A | Discharge status: Home / Self Care | Payer: BC Managed Care – PPO | Source: Ambulatory Visit | Attending: Obstetrics and Gynecology | Admitting: Obstetrics and Gynecology

## 2014-07-02 DIAGNOSIS — R928 Other abnormal and inconclusive findings on diagnostic imaging of breast: Secondary | ICD-10-CM

## 2014-07-03 ENCOUNTER — Other Ambulatory Visit: Payer: BC Managed Care – PPO

## 2014-07-04 ENCOUNTER — Other Ambulatory Visit: Payer: Self-pay

## 2014-07-04 ENCOUNTER — Other Ambulatory Visit: Payer: BC Managed Care – PPO

## 2014-08-10 DIAGNOSIS — K219 Gastro-esophageal reflux disease without esophagitis: Secondary | ICD-10-CM | POA: Insufficient documentation

## 2015-01-30 ENCOUNTER — Other Ambulatory Visit: Payer: Self-pay | Admitting: Obstetrics and Gynecology

## 2015-01-30 DIAGNOSIS — R921 Mammographic calcification found on diagnostic imaging of breast: Secondary | ICD-10-CM

## 2015-02-04 ENCOUNTER — Other Ambulatory Visit: Payer: Self-pay | Admitting: Obstetrics and Gynecology

## 2015-02-04 ENCOUNTER — Ambulatory Visit
Admission: RE | Admit: 2015-02-04 | Discharge: 2015-02-04 | Disposition: A | Discharge status: Home / Self Care | Payer: BC Managed Care – PPO | Source: Ambulatory Visit | Attending: Obstetrics and Gynecology | Admitting: Obstetrics and Gynecology

## 2015-02-04 DIAGNOSIS — R921 Mammographic calcification found on diagnostic imaging of breast: Secondary | ICD-10-CM

## 2015-07-16 ENCOUNTER — Emergency Department (HOSPITAL_BASED_OUTPATIENT_CLINIC_OR_DEPARTMENT_OTHER)
Admission: EM | Admit: 2015-07-16 | Discharge: 2015-07-16 | Disposition: A | Discharge status: Home / Self Care | Payer: Worker's Compensation | Attending: Emergency Medicine | Admitting: Emergency Medicine

## 2015-07-16 ENCOUNTER — Encounter (HOSPITAL_BASED_OUTPATIENT_CLINIC_OR_DEPARTMENT_OTHER): Payer: Self-pay

## 2015-07-16 DIAGNOSIS — M545 Low back pain, unspecified: Secondary | ICD-10-CM

## 2015-07-16 DIAGNOSIS — S3992XA Unspecified injury of lower back, initial encounter: Secondary | ICD-10-CM | POA: Insufficient documentation

## 2015-07-16 DIAGNOSIS — Z8742 Personal history of other diseases of the female genital tract: Secondary | ICD-10-CM | POA: Diagnosis not present

## 2015-07-16 DIAGNOSIS — E669 Obesity, unspecified: Secondary | ICD-10-CM | POA: Insufficient documentation

## 2015-07-16 DIAGNOSIS — Y998 Other external cause status: Secondary | ICD-10-CM | POA: Insufficient documentation

## 2015-07-16 DIAGNOSIS — Z8739 Personal history of other diseases of the musculoskeletal system and connective tissue: Secondary | ICD-10-CM | POA: Diagnosis not present

## 2015-07-16 DIAGNOSIS — S8002XA Contusion of left knee, initial encounter: Secondary | ICD-10-CM

## 2015-07-16 DIAGNOSIS — Y9289 Other specified places as the place of occurrence of the external cause: Secondary | ICD-10-CM | POA: Insufficient documentation

## 2015-07-16 DIAGNOSIS — Y9389 Activity, other specified: Secondary | ICD-10-CM | POA: Insufficient documentation

## 2015-07-16 DIAGNOSIS — I10 Essential (primary) hypertension: Secondary | ICD-10-CM | POA: Diagnosis not present

## 2015-07-16 DIAGNOSIS — W19XXXA Unspecified fall, initial encounter: Secondary | ICD-10-CM

## 2015-07-16 DIAGNOSIS — Z8619 Personal history of other infectious and parasitic diseases: Secondary | ICD-10-CM | POA: Diagnosis not present

## 2015-07-16 DIAGNOSIS — W010XXA Fall on same level from slipping, tripping and stumbling without subsequent striking against object, initial encounter: Secondary | ICD-10-CM | POA: Insufficient documentation

## 2015-07-16 NOTE — ED Provider Notes (Signed)
CSN: [id number]     Arrival date & time 07/16/15  1046 History   First MD Initiated Contact with Patient 07/16/15 1136     Chief Complaint  Patient presents with  . Fall     (Consider location/radiation/quality/duration/timing/severity/associated sxs/prior Treatment) HPI Comments: 54 year old female who presents 1 week after a fall. She was at work. She tripped on a large acorn at the bottom of some stairs. She fell forward and landed on her left knee and her arms.  She was unable to go to work the next day. However, over the past week, her symptoms have improved. Now, she complains of mild left knee pain and mild low back pain. She was worried that her ankle is going to hurt after the fall, but feels fine now.    Patient is a 54 y.o. female presenting with fall.  Fall This is a new problem. Episode onset: One week ago. Episode frequency: Once. Associated symptoms comments: Left knee pain, low back pain.. Exacerbated by: Knee pain worse with walking. Low back pain better with walking. Treatments tried: Aleve. The treatment provided moderate relief.    Past Medical History  Diagnosis Date  . Hyperlipidemia   . Obesity   . Osteopenia   . Menorrhagia   . Allergy   . Bone spur     Right Foot  . Other and unspecified hyperlipidemia   . Essential hypertension, benign   . Unspecified vitamin D deficiency   . Pain in joint, ankle and foot   . Dermatophytosis of nail    Past Surgical History  Procedure Laterality Date  . Appendectomy    . Tonsillectomy     Family History  Problem Relation Age of Onset  . Lung disease Mother   . Osteoporosis Mother   . CVA Father   . Aneurysm Sister   . Heart disease Brother   . Diabetes Maternal Aunt   . Cancer Maternal Aunt     Breast Cancer   . Diabetes Maternal Uncle    Social History  Substance Use Topics  . Smoking status: Never Smoker   . Smokeless tobacco: Never Used  . Alcohol Use: Yes     Comment: occ   OB History    No  data available     Review of Systems  All other systems reviewed and are negative.     Allergies  Review of patient's allergies indicates no known allergies.  Home Medications   Prior to Admission medications   Medication Sig Start Date End Date Taking? Authorizing Provider  Triamterene-HCTZ (MAXZIDE PO) Take by mouth.   Yes Historical Provider, MD  traMADol-acetaminophen (ULTRACET) 37.5-325 MG per tablet Take 1-2 tablets by mouth every 6 (six) hours as needed. 11/11/13   Jonathon Resides, MD   BP 142/80 mmHg  Pulse 84  Temp(Src) 98.6 F (37 C) (Oral)  Resp 20  Ht 5\' 4"  (1.626 m)  Wt 205 lb (92.987 kg)  BMI 35.17 kg/m2  SpO2 99%  LMP 10/28/2013 Physical Exam  Constitutional: She is oriented to person, place, and time. She appears well-developed and well-nourished. No distress.  HENT:  Head: Normocephalic and atraumatic.  Eyes: Conjunctivae are normal. No scleral icterus.  Neck: Neck supple.  Cardiovascular: Normal rate and intact distal pulses.   Pulmonary/Chest: Effort normal. No stridor. No respiratory distress.  Abdominal: Normal appearance. She exhibits no distension.  Musculoskeletal:       Thoracic back: She exhibits no tenderness and no bony tenderness.  Back:       Legs: Neurological: She is alert and oriented to person, place, and time.  Skin: Skin is warm and dry. No rash noted.  Psychiatric: She has a normal mood and affect. Her behavior is normal.  Nursing note and vitals reviewed.   ED Course  Procedures (including critical care time) Labs Review Labs Reviewed - No data to display  Imaging Review No results found. I have personally reviewed and evaluated these images and lab results as part of my medical decision-making.   EKG Interpretation None      MDM   Final diagnoses:  Fall, initial encounter  Bilateral low back pain without sciatica  Knee contusion, left, initial encounter    54 year old female presenting after a fall one  week ago. 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 She complains of left knee pain and low back pain. Regarding her knee pain, she has been ambulatory, has a healing abrasion/contusion. Otherwise, no bony tenderness. I don't think she needs imaging of this..  Regarding her back pain, it seems to get worse and night and early morning, but improves after she gets up and walks around. She has mild low back tenderness, most significant on lumbar paraspinal muscles. I don't suspect bony injury given the time course since injury, and mild nature of symptoms, minimal tenderness to palpation. She also has no numbness, tingling, weakness of either lower extremity. I do not think she needs imaging of her back either.  Advised NSAIDs and outpatient follow-up as needed.    Serita Grit, MD 07/16/15 1239

## 2015-07-16 NOTE — ED Notes (Signed)
Fall last week at work-c/o cont'd left knee pain and mid/lower back

## 2015-07-16 NOTE — Discharge Instructions (Signed)
Back Pain, Adult Back pain is very common. The pain often gets better over time. The cause of back pain is usually not dangerous. Most people can learn to manage their back pain on their own.  HOME CARE  Watch your back pain for any changes. The following actions may help to lessen any pain you are feeling:  Stay active. Start with short walks on flat ground if you can. Try to walk farther each day.  Exercise regularly as told by your doctor. Exercise helps your back heal faster. It also helps avoid future injury by keeping your muscles strong and flexible.  Do not sit, drive, or stand in one place for more than 30 minutes.  Do not stay in bed. Resting more than 1-2 days can slow down your recovery.  Be careful when you bend or lift an object. Use good form when lifting:  Bend at your knees.  Keep the object close to your body.  Do not twist.  Sleep on a firm mattress. Lie on your side, and bend your knees. If you lie on your back, put a pillow under your knees.  Take medicines only as told by your doctor.  Put ice on the injured area.  Put ice in a plastic bag.  Place a towel between your skin and the bag.  Leave the ice on for 20 minutes, 2-3 times a day for the first 2-3 days. After that, you can switch between ice and heat packs.  Avoid feeling anxious or stressed. Find good ways to deal with stress, such as exercise.  Maintain a healthy weight. Extra weight puts stress on your back. GET HELP IF:   You have pain that does not go away with rest or medicine.  You have worsening pain that goes down into your legs or buttocks.  You have pain that does not get better in one week.  You have pain at night.  You lose weight.  You have a fever or chills. GET HELP RIGHT AWAY IF:   You cannot control when you poop (bowel movement) or pee (urinate).  Your arms or legs feel weak.  Your arms or legs lose feeling (numbness).  You feel sick to your stomach (nauseous) or  throw up (vomit).  You have belly (abdominal) pain.  You feel like you may pass out (faint).   This information is not intended to replace advice given to you by your health care provider. Make sure you discuss any questions you have with your health care provider.   Document Released: 02/22/2008 Document Revised: 09/26/2014 Document Reviewed: 01/07/2014 Elsevier Interactive Patient Education 2016 [place] A contusion is a deep bruise. Contusions are the result of a blunt injury to tissues and muscle fibers under the skin. The injury causes bleeding under the skin. The skin overlying the contusion may turn blue, purple, or yellow. Minor injuries will give you a painless contusion, but more severe contusions may stay painful and swollen for a few weeks.  CAUSES  This condition is usually caused by a blow, trauma, or direct force to an area of the body. SYMPTOMS  Symptoms of this condition include:  Swelling of the injured area.  Pain and tenderness in the injured area.  Discoloration. The area may have redness and then turn blue, purple, or yellow. DIAGNOSIS  This condition is diagnosed based on a physical exam and medical history. An X-ray, CT scan, or MRI may be needed to determine if there are any associated  injuries, such as broken bones (fractures). TREATMENT  Specific treatment for this condition depends on what area of the body was injured. In general, the best treatment for a contusion is resting, icing, applying pressure to (compression), and elevating the injured area. This is often called the RICE strategy. Over-the-counter anti-inflammatory medicines may also be recommended for pain control.  HOME CARE INSTRUCTIONS   Rest the injured area.  If directed, apply ice to the injured area:  Put ice in a plastic bag.  Place a towel between your skin and the bag.  Leave the ice on for 20 minutes, 2-3 times per day.  If directed, apply light compression to  the injured area using an elastic bandage. Make sure the bandage is not wrapped too tightly. Remove and reapply the bandage as directed by your health care provider.  If possible, raise (elevate) the injured area above the level of your heart while you are sitting or lying down.  Take over-the-counter and prescription medicines only as told by your health care provider. SEEK MEDICAL CARE IF:  Your symptoms do not improve after several days of treatment.  Your symptoms get worse.  You have difficulty moving the injured area. SEEK IMMEDIATE MEDICAL CARE IF:   You have severe pain.  You have numbness in a hand or foot.  Your hand or foot turns pale or cold.   This information is not intended to replace advice given to you by your health care provider. Make sure you discuss any questions you have with your health care provider.   Document Released: 06/15/2005 Document Revised: 05/27/2015 Document Reviewed: 01/21/2015 Elsevier Interactive Patient Education [employer].

## 2015-11-13 ENCOUNTER — Other Ambulatory Visit: Payer: Self-pay | Admitting: Obstetrics and Gynecology

## 2015-11-13 DIAGNOSIS — R921 Mammographic calcification found on diagnostic imaging of breast: Secondary | ICD-10-CM

## 2015-12-02 ENCOUNTER — Ambulatory Visit
Admission: RE | Admit: 2015-12-02 | Discharge: 2015-12-02 | Disposition: A | Discharge status: Home / Self Care | Payer: BC Managed Care – PPO | Source: Ambulatory Visit | Attending: Obstetrics and Gynecology | Admitting: Obstetrics and Gynecology

## 2015-12-02 DIAGNOSIS — R921 Mammographic calcification found on diagnostic imaging of breast: Secondary | ICD-10-CM

## 2017-08-15 ENCOUNTER — Other Ambulatory Visit: Payer: Self-pay | Admitting: Obstetrics and Gynecology

## 2017-08-15 DIAGNOSIS — R921 Mammographic calcification found on diagnostic imaging of breast: Secondary | ICD-10-CM

## 2017-08-18 ENCOUNTER — Other Ambulatory Visit: Payer: Self-pay | Admitting: Obstetrics and Gynecology

## 2017-08-18 ENCOUNTER — Ambulatory Visit
Admission: RE | Admit: 2017-08-18 | Discharge: 2017-08-18 | Disposition: A | Discharge status: Home / Self Care | Payer: BC Managed Care – PPO | Source: Ambulatory Visit | Attending: Obstetrics and Gynecology | Admitting: Obstetrics and Gynecology

## 2017-08-18 DIAGNOSIS — R921 Mammographic calcification found on diagnostic imaging of breast: Secondary | ICD-10-CM

## 2017-08-25 ENCOUNTER — Ambulatory Visit
Admission: RE | Admit: 2017-08-25 | Discharge: 2017-08-25 | Disposition: A | Discharge status: Home / Self Care | Payer: BC Managed Care – PPO | Source: Ambulatory Visit | Attending: Obstetrics and Gynecology | Admitting: Obstetrics and Gynecology

## 2017-08-25 ENCOUNTER — Other Ambulatory Visit: Payer: Self-pay | Admitting: Obstetrics and Gynecology

## 2017-08-25 DIAGNOSIS — R921 Mammographic calcification found on diagnostic imaging of breast: Secondary | ICD-10-CM

## 2017-09-19 HISTORY — PX: MENISCUS REPAIR: SHX5179

## 2017-12-08 DIAGNOSIS — M25562 Pain in left knee: Secondary | ICD-10-CM | POA: Insufficient documentation

## 2017-12-08 DIAGNOSIS — S83242A Other tear of medial meniscus, current injury, left knee, initial encounter: Secondary | ICD-10-CM | POA: Insufficient documentation

## 2017-12-08 HISTORY — DX: Other tear of medial meniscus, current injury, left knee, initial encounter: S83.242A

## 2019-04-16 DIAGNOSIS — G5601 Carpal tunnel syndrome, right upper limb: Secondary | ICD-10-CM | POA: Insufficient documentation

## 2019-04-16 DIAGNOSIS — M7520 Bicipital tendinitis, unspecified shoulder: Secondary | ICD-10-CM | POA: Insufficient documentation

## 2019-04-16 DIAGNOSIS — M79641 Pain in right hand: Secondary | ICD-10-CM | POA: Insufficient documentation

## 2019-04-16 DIAGNOSIS — M25519 Pain in unspecified shoulder: Secondary | ICD-10-CM | POA: Insufficient documentation

## 2019-08-26 DIAGNOSIS — D497 Neoplasm of unspecified behavior of endocrine glands and other parts of nervous system: Secondary | ICD-10-CM | POA: Insufficient documentation

## 2019-11-16 ENCOUNTER — Ambulatory Visit: Payer: BC Managed Care – PPO | Attending: Internal Medicine

## 2019-11-16 DIAGNOSIS — Z23 Encounter for immunization: Secondary | ICD-10-CM | POA: Insufficient documentation

## 2019-11-16 NOTE — Progress Notes (Signed)
   Covid-19 Vaccination Clinic  Name:  Emily Stephens    MRN: [id number] DOB: [birth year]  11/16/2019  Emily Stephens was observed post Covid-19 immunization for 15 minutes without incidence. She was provided with Vaccine Information Sheet and instruction to access the V-Safe system.   Emily Stephens was instructed to call 911 with any severe reactions post vaccine: Marland Kitchen Difficulty breathing  . Swelling of your face and throat  . A fast heartbeat  . A bad rash all over your body  . Dizziness and weakness    Immunizations Administered    Name Date Dose VIS Date Route   Pfizer COVID-19 Vaccine 11/16/2019  1:23 PM 0.3 mL 08/30/2019 Intramuscular   Manufacturer: [place]   Lot: [id number]   [place]: [id number]

## 2019-12-07 ENCOUNTER — Ambulatory Visit: Payer: BC Managed Care – PPO | Attending: Internal Medicine

## 2019-12-07 DIAGNOSIS — Z23 Encounter for immunization: Secondary | ICD-10-CM

## 2019-12-07 NOTE — Progress Notes (Signed)
   Covid-19 Vaccination Clinic  Name:  Emily Stephens    MRN: [id number] DOB: October 25, 1960  12/07/2019  Ms. Gieck was observed post Covid-19 immunization for 15 minutes without incident. She was provided with Vaccine Information Sheet and instruction to access the V-Safe system.   Ms. Ferrini was instructed to call 911 with any severe reactions post vaccine: Marland Kitchen Difficulty breathing  . Swelling of face and throat  . A fast heartbeat  . A bad rash all over body  . Dizziness and weakness   Immunizations Administered    Name Date Dose VIS Date Route   Pfizer COVID-19 Vaccine 12/07/2019 10:12 AM 0.3 mL 08/30/2019 Intramuscular   Manufacturer: [place]   Lot: [id number]   [place]: [id number]

## 2020-02-19 ENCOUNTER — Other Ambulatory Visit: Payer: Self-pay | Admitting: Obstetrics and Gynecology

## 2020-02-19 DIAGNOSIS — R928 Other abnormal and inconclusive findings on diagnostic imaging of breast: Secondary | ICD-10-CM

## 2020-02-27 DIAGNOSIS — M1712 Unilateral primary osteoarthritis, left knee: Secondary | ICD-10-CM | POA: Insufficient documentation

## 2020-03-02 ENCOUNTER — Other Ambulatory Visit: Payer: BC Managed Care – PPO

## 2020-03-04 ENCOUNTER — Other Ambulatory Visit: Payer: Self-pay | Admitting: Nurse Practitioner

## 2020-03-12 ENCOUNTER — Other Ambulatory Visit: Payer: Self-pay

## 2020-03-12 ENCOUNTER — Ambulatory Visit
Admission: RE | Admit: 2020-03-12 | Discharge: 2020-03-12 | Disposition: A | Discharge status: Home / Self Care | Payer: BC Managed Care – PPO | Source: Ambulatory Visit | Attending: Obstetrics and Gynecology | Admitting: Obstetrics and Gynecology

## 2020-03-12 DIAGNOSIS — R928 Other abnormal and inconclusive findings on diagnostic imaging of breast: Secondary | ICD-10-CM

## 2020-03-16 ENCOUNTER — Other Ambulatory Visit: Payer: BC Managed Care – PPO

## 2020-03-20 ENCOUNTER — Other Ambulatory Visit: Payer: Self-pay

## 2020-03-20 ENCOUNTER — Ambulatory Visit: Payer: BC Managed Care – PPO | Admitting: Podiatry

## 2020-03-20 ENCOUNTER — Encounter: Payer: Self-pay | Admitting: Podiatry

## 2020-03-20 ENCOUNTER — Ambulatory Visit (INDEPENDENT_AMBULATORY_CARE_PROVIDER_SITE_OTHER): Payer: BC Managed Care – PPO

## 2020-03-20 DIAGNOSIS — M722 Plantar fascial fibromatosis: Secondary | ICD-10-CM

## 2020-03-20 MED ORDER — MELOXICAM 15 MG PO TABS
15.0000 mg | ORAL_TABLET | Freq: Every day | ORAL | 3 refills | Status: DC
Start: 2020-03-20 — End: 2020-10-09

## 2020-03-20 MED ORDER — METHYLPREDNISOLONE 4 MG PO TBPK: ORAL_TABLET | ORAL | 0 refills | Status: DC

## 2020-03-20 NOTE — Progress Notes (Signed)
  Subjective:  Patient ID: Emily Stephens, female    DOB: [birth year],  MRN: [id number] HPI Chief Complaint  Patient presents with  . Foot Pain    Plantar heel right - aching x few months, AM pain, history of PF, in past used arch band, diclofenac, wraps, taking aleve currently  . New Patient (Initial Visit)    59 y.o. female presents with the above complaint.   ROS: Denies fever chills nausea vomiting muscle aches pains calf pain back pain chest pain shortness of breath.  Past Medical History:  Diagnosis Date  . Allergy   . Bone spur    Right Foot  . Dermatophytosis of nail   . Essential hypertension, benign   . Hyperlipidemia   . Menorrhagia   . Obesity   . Osteopenia   . Other and unspecified hyperlipidemia   . Pain in joint, ankle and foot   . Unspecified vitamin D deficiency    Past Surgical History:  Procedure Laterality Date  . APPENDECTOMY    . TONSILLECTOMY      Current Outpatient Medications:  .  buPROPion (WELLBUTRIN XL) 300 MG 24 hr tablet, , Disp: , Rfl:  .  meloxicam (MOBIC) 15 MG tablet, Take 1 tablet (15 mg total) by mouth daily., Disp: 30 tablet, Rfl: 3 .  methylPREDNISolone (MEDROL DOSEPAK) 4 MG TBPK tablet, 6 day dose pack - take as directed, Disp: 21 tablet, Rfl: 0 .  Triamterene-HCTZ (MAXZIDE PO), Take by mouth., Disp: , Rfl:   No Known Allergies Review of Systems Objective:  There were no vitals filed for this visit.  General: Well developed, nourished, in no acute distress, alert and oriented x3   Dermatological: Skin is warm, dry and supple bilateral. Nails x 10 are well maintained; remaining integument appears unremarkable at this time. There are no open sores, no preulcerative lesions, no rash or signs of infection present.  Vascular: Dorsalis Pedis artery and Posterior Tibial artery pedal pulses are 2/4 bilateral with immedate capillary fill time. Pedal hair growth present. No varicosities and no lower extremity edema present bilateral.    Neruologic: Grossly intact via light touch bilateral. Vibratory intact via tuning fork bilateral. Protective threshold with Semmes Wienstein monofilament intact to all pedal sites bilateral. Patellar and Achilles deep tendon reflexes 2+ bilateral. No Babinski or clonus noted bilateral.   Musculoskeletal: No gross boney pedal deformities bilateral. No pain, crepitus, or limitation noted with foot and ankle range of motion bilateral. Muscular strength 5/5 in all groups tested bilateral.  She has moderate to severe pain on palpation medial calcaneal tubercle of the right foot less degree of the left foot.  Gait: Unassisted, Nonantalgic.    Radiographs:  Radiographs taken today demonstrate pes planus with soft tissue increase in density plantar fascial kidney insertion site plantar distally oriented calcaneal spurs noted on the right foot.  Assessment & Plan:   Assessment: Plantar fasciitis right greater than left  Plan: Discussed etiology pathology conservative surgical therapies at this point I injected right heel today with 20 mg of Kenalog 5 mg Marcaine for maximal tenderness.  Dispensed a plantar fascial brace and she thinks she has a night splint at home, discussed appropriate shoe gear stretching exercises ice therapy sugar modifications plate.  Follow-up with her in 1 month    Emily Stephens, [place]

## 2020-03-20 NOTE — Patient Instructions (Signed)

## 2020-04-21 ENCOUNTER — Ambulatory Visit: Payer: BC Managed Care – PPO | Admitting: Podiatry

## 2020-06-09 DIAGNOSIS — J302 Other seasonal allergic rhinitis: Secondary | ICD-10-CM | POA: Insufficient documentation

## 2020-09-22 DIAGNOSIS — I10 Essential (primary) hypertension: Secondary | ICD-10-CM | POA: Insufficient documentation

## 2020-09-30 ENCOUNTER — Encounter: Payer: Self-pay | Admitting: Gastroenterology

## 2020-10-09 ENCOUNTER — Ambulatory Visit (AMBULATORY_SURGERY_CENTER): Payer: Self-pay

## 2020-10-09 ENCOUNTER — Other Ambulatory Visit: Payer: Self-pay

## 2020-10-09 VITALS — Ht 64.0 in | Wt 200.0 lb

## 2020-10-09 DIAGNOSIS — Z1211 Encounter for screening for malignant neoplasm of colon: Secondary | ICD-10-CM

## 2020-10-09 MED ORDER — PLENVU 140 G PO SOLR: 1.0000 | ORAL | 0 refills | Status: DC

## 2020-10-09 NOTE — Progress Notes (Signed)
Pre visit completed via phone call; patient verified name, DOB, and address; No egg or soy allergy known to patient  No issues with past sedation with any surgeries or procedures No intubation problems in the past  No FH of Malignant Hyperthermia No diet pills per patient No home 02 use per patient  No blood thinners per patient  Pt denies issues with constipation  No A fib or A flutter  EMMI video via MyChart  COVID 19 guidelines implemented in PV today with Pt and RN  Pt is fully vaccinated  for Covid x 2 + booster Coupon given to pt in PV today , Code to Pharmacy  Due to the COVID-19 pandemic we are asking patients to follow certain guidelines.  Pt aware of COVID protocols and [facility] guidelines

## 2020-10-15 ENCOUNTER — Encounter: Payer: Self-pay | Admitting: Gastroenterology

## 2020-10-23 ENCOUNTER — Encounter: Payer: Self-pay | Admitting: Gastroenterology

## 2020-10-23 ENCOUNTER — Ambulatory Visit (AMBULATORY_SURGERY_CENTER): Payer: BC Managed Care – PPO | Admitting: Gastroenterology

## 2020-10-23 ENCOUNTER — Other Ambulatory Visit: Payer: Self-pay

## 2020-10-23 VITALS — BP 141/80 | HR 74 | Temp 98.3°F | Resp 18 | Ht 64.0 in | Wt 200.0 lb

## 2020-10-23 DIAGNOSIS — K635 Polyp of colon: Secondary | ICD-10-CM | POA: Diagnosis not present

## 2020-10-23 DIAGNOSIS — Z1211 Encounter for screening for malignant neoplasm of colon: Secondary | ICD-10-CM | POA: Diagnosis present

## 2020-10-23 DIAGNOSIS — D120 Benign neoplasm of cecum: Secondary | ICD-10-CM

## 2020-10-23 DIAGNOSIS — D122 Benign neoplasm of ascending colon: Secondary | ICD-10-CM

## 2020-10-23 DIAGNOSIS — D124 Benign neoplasm of descending colon: Secondary | ICD-10-CM

## 2020-10-23 MED ORDER — SODIUM CHLORIDE 0.9 % IV SOLN: 500.0000 mL | Freq: Once | INTRAVENOUS | Status: DC

## 2020-10-23 NOTE — Progress Notes (Signed)
Report given to PACU, vss 

## 2020-10-23 NOTE — Patient Instructions (Signed)
Handout given:  Polyps Resume previous diet Continue current medications Await pathology results  YOU HAD AN ENDOSCOPIC PROCEDURE TODAY AT THE  ENDOSCOPY CENTER:   Refer to the procedure report that was given to you for any specific questions about what was found during the examination.  If the procedure report does not answer your questions, please call your gastroenterologist to clarify.  If you requested that your care partner not be given the details of your procedure findings, then the procedure report has been included in a sealed envelope for you to review at your convenience later.  YOU SHOULD EXPECT: Some feelings of bloating in the abdomen. Passage of more gas than usual.  Walking can help get rid of the air that was put into your GI tract during the procedure and reduce the bloating. If you had a lower endoscopy (such as a colonoscopy or flexible sigmoidoscopy) you may notice spotting of blood in your stool or on the toilet paper. If you underwent a bowel prep for your procedure, you may not have a normal bowel movement for a few days.  Please Note:  You might notice some irritation and congestion in your nose or some drainage.  This is from the oxygen used during your procedure.  There is no need for concern and it should clear up in a day or so.  SYMPTOMS TO REPORT IMMEDIATELY:   Following lower endoscopy (colonoscopy or flexible sigmoidoscopy):  Excessive amounts of blood in the stool  Significant tenderness or worsening of abdominal pains  Swelling of the abdomen that is new, acute  Fever of 100F or higher  For urgent or emergent issues, a gastroenterologist can be reached at any hour by calling [phone]. Do not use MyChart messaging for urgent concerns.    DIET:  We do recommend a small meal at first, but then you may proceed to your regular diet.  Drink plenty of fluids but you should avoid alcoholic beverages for 24 hours.  ACTIVITY:  You should plan to take  it easy for the rest of today and you should NOT DRIVE or use heavy machinery until tomorrow (because of the sedation medicines used during the test).    FOLLOW UP: Our staff will call the number listed on your records 48-72 hours following your procedure to check on you and address any questions or concerns that you may have regarding the information given to you following your procedure. If we do not reach you, we will leave a message.  We will attempt to reach you two times.  During this call, we will ask if you have developed any symptoms of COVID 19. If you develop any symptoms (ie: fever, flu-like symptoms, shortness of breath, cough etc.) before then, please call [phone].  If you test positive for Covid 19 in the 2 weeks post procedure, please call and report this information to us.    If any biopsies were taken you will be contacted by phone or by letter within the next 1-3 weeks.  Please call us at [phone] if you have not heard about the biopsies in 3 weeks.   SIGNATURES/CONFIDENTIALITY: You and/or your care partner have signed paperwork which will be entered into your electronic medical record.  These signatures attest to the fact that that the information above on your After Visit Summary has been reviewed and is understood.  Full responsibility of the confidentiality of this discharge information lies with you and/or your care-partner. 

## 2020-10-23 NOTE — Progress Notes (Signed)
Medical history reviewed with no changes noted since PV. VS assessed by C.W 

## 2020-10-23 NOTE — Progress Notes (Signed)
Called to room to assist during endoscopic procedure.  Patient ID and intended procedure confirmed with present staff. Received instructions for my participation in the procedure from the performing physician.  

## 2020-10-23 NOTE — Op Note (Signed)
[facility] Patient Name: Emily Stephens Procedure Date: 10/23/2020 7:50 AM MRN: [id number] Endoscopist: Ladene Artist , MD Age: 60 Referring MD:  Date of Birth: 27-Oct-1960 Gender: Female Account #: [id number] Procedure:                Colonoscopy Indications:              Screening for colorectal malignant neoplasm Medicines:                Monitored Anesthesia Care Procedure:                Pre-Anesthesia Assessment:                           - Prior to the procedure, a History and Physical                            was performed, and patient medications and                            allergies were reviewed. The patient's tolerance of                            previous anesthesia was also reviewed. The risks                            and benefits of the procedure and the sedation                            options and risks were discussed with the patient.                            All questions were answered, and informed consent                            was obtained. Prior Anticoagulants: The patient has                            taken no previous anticoagulant or antiplatelet                            agents. ASA Grade Assessment: II - A patient with                            mild systemic disease. After reviewing the risks                            and benefits, the patient was deemed in                            satisfactory condition to undergo the procedure.                           After obtaining informed consent, the colonoscope  was passed under direct vision. Throughout the                            procedure, the patient's blood pressure, pulse, and                            oxygen saturations were monitored continuously. The                            Olympus CF-HQ190L ([id number]) Colonoscope was                            introduced through the anus and advanced to the the                            cecum, identified  by appendiceal orifice and                            ileocecal valve. The ileocecal valve, appendiceal                            orifice, and rectum were photographed. The quality                            of the bowel preparation was good. The colonoscopy                            was performed without difficulty. The patient                            tolerated the procedure well. Scope In: 8:02:51 AM Scope Out: 8:22:22 AM Scope Withdrawal Time: 0 hours 15 minutes 3 seconds  Total Procedure Duration: 0 hours 19 minutes 31 seconds  Findings:                 The perianal and digital rectal examinations were                            normal.                           Two sessile polyps were found in the ascending                            colon and cecum. The polyps were 7 mm in size.                            These polyps were removed with a cold snare.                            Resection and retrieval were complete.                           A single small localized angiodysplastic lesion  without bleeding was found in the cecum.                           Two sessile polyps were found in the descending                            colon. The polyps were 4 to 5 mm in size. These                            polyps were removed with a cold biopsy forceps.                            Resection and retrieval were complete.                           The exam was otherwise without abnormality on                            direct and retroflexion views. Complications:            No immediate complications. Estimated blood loss:                            None. Estimated Blood Loss:     Estimated blood loss: none. Impression:               - Two 7 mm polyps in the ascending colon and in the                            cecum, removed with a cold snare. Resected and                            retrieved.                           - A single non-bleeding colonic  angiodysplastic                            lesion.                           - Two 4 to 5 mm polyps in the descending colon,                            removed with a cold biopsy forceps. Resected and                            retrieved.                           - The examination was otherwise normal on direct                            and retroflexion views. Recommendation:           - Repeat colonoscopy date to be determined  after                            pending pathology results are reviewed for                            surveillance based on pathology results.                           - Patient has a contact number available for                            emergencies. The signs and symptoms of potential                            delayed complications were discussed with the                            patient. Return to normal activities tomorrow.                            Written discharge instructions were provided to the                            patient.                           - Resume previous diet.                           - Continue present medications.                           - Await pathology results. Ladene Artist, MD 10/23/2020 8:28:43 AM This report has been signed electronically.

## 2020-10-27 ENCOUNTER — Telehealth: Payer: Self-pay

## 2020-10-27 NOTE — Telephone Encounter (Signed)
  Follow up Call-  Call back number 10/23/2020  Post procedure Call Back phone  # [phone]  Permission to leave phone message Yes  Some recent data might be hidden     Patient questions:  Do you have a fever, pain , or abdominal swelling? No. Pain Score  0 *  Have you tolerated food without any problems? Yes.    Have you been able to return to your normal activities? Yes.    Do you have any questions about your discharge instructions: Diet   No. Medications  No. Follow up visit  No.  Do you have questions or concerns about your Care? No.  Actions: * If pain score is 4 or above: No action needed, pain <4.  1. Have you developed a fever since your procedure? no  2.   Have you had an respiratory symptoms (SOB or cough) since your procedure? no  3.   Have you tested positive for COVID 19 since your procedure no  4.   Have you had any family members/close contacts diagnosed with the COVID 19 since your procedure?  no   If yes to any of these questions please route to Joylene John, RN and Joella Prince, RN

## 2020-11-11 ENCOUNTER — Encounter: Payer: Self-pay | Admitting: Gastroenterology

## 2021-02-08 ENCOUNTER — Other Ambulatory Visit: Payer: Self-pay | Admitting: Physician Assistant

## 2021-02-08 DIAGNOSIS — D329 Benign neoplasm of meninges, unspecified: Secondary | ICD-10-CM

## 2021-02-13 ENCOUNTER — Ambulatory Visit
Admission: RE | Admit: 2021-02-13 | Discharge: 2021-02-13 | Disposition: A | Discharge status: Home / Self Care | Payer: BC Managed Care – PPO | Source: Ambulatory Visit | Attending: Physician Assistant | Admitting: Physician Assistant

## 2021-02-13 ENCOUNTER — Other Ambulatory Visit: Payer: Self-pay

## 2021-02-13 DIAGNOSIS — D329 Benign neoplasm of meninges, unspecified: Secondary | ICD-10-CM

## 2021-02-13 MED ORDER — GADOBENATE DIMEGLUMINE 529 MG/ML IV SOLN
20.0000 mL | Freq: Once | INTRAVENOUS | Status: AC | PRN
  Administered 2021-02-13: 20 mL via INTRAVENOUS

## 2021-02-25 DIAGNOSIS — Z6836 Body mass index (BMI) 36.0-36.9, adult: Secondary | ICD-10-CM | POA: Insufficient documentation

## 2021-02-25 DIAGNOSIS — R030 Elevated blood-pressure reading, without diagnosis of hypertension: Secondary | ICD-10-CM | POA: Insufficient documentation

## 2021-04-20 ENCOUNTER — Other Ambulatory Visit: Payer: Self-pay | Admitting: Obstetrics and Gynecology

## 2021-04-20 DIAGNOSIS — Z803 Family history of malignant neoplasm of breast: Secondary | ICD-10-CM

## 2021-05-18 ENCOUNTER — Other Ambulatory Visit: Payer: Self-pay

## 2021-05-18 ENCOUNTER — Encounter: Payer: Self-pay | Admitting: Family Medicine

## 2021-05-18 ENCOUNTER — Ambulatory Visit: Payer: BC Managed Care – PPO | Admitting: Family Medicine

## 2021-05-18 VITALS — BP 132/76 | HR 84 | Temp 97.0°F | Ht 63.0 in | Wt 207.6 lb

## 2021-05-18 DIAGNOSIS — E7800 Pure hypercholesterolemia, unspecified: Secondary | ICD-10-CM | POA: Insufficient documentation

## 2021-05-18 DIAGNOSIS — Z23 Encounter for immunization: Secondary | ICD-10-CM | POA: Diagnosis not present

## 2021-05-18 DIAGNOSIS — R768 Other specified abnormal immunological findings in serum: Secondary | ICD-10-CM | POA: Diagnosis not present

## 2021-05-18 DIAGNOSIS — Z0000 Encounter for general adult medical examination without abnormal findings: Secondary | ICD-10-CM

## 2021-05-18 NOTE — Progress Notes (Addendum)
Oh this pain  Established Patient Office Visit  Subjective:  Patient ID: Emily Stephens, female    DOB: 09-25-1960  Age: 60 y.o. MRN: [id number]  CC:  Chief Complaint  Patient presents with   Establish Care    NP/establish care patient was told that she may have hepatitis B not sure what she needs to do to find out if she does.     HPI Emily Stephens presents for establishment of care by way of transfer.  Her doctor is retiring.  Donated blood through the [employer] earlier this month received a letter that her hep B core antibody test was positive.  HIV and hep C were negative.  She has been married for the last 10 years and monogamous.  Has no history of hepatitis.  Denies any history of yellow jaundice.  She is a retired Pharmacist, hospital.  Well woman care is through her GYN.  Recent mammograms and Pap smears were negative.  She is being treated for elevated cholesterol with atorvastatin.  No issues taking this medication.  She does not use IV drugs  Past Medical History:  Diagnosis Date   Allergy    seasonal allergies   Arthritis    bilateral knees   Bone spur    Right Foot   Cataract    not a surgical candidate at this time (10/09/2020)   Depression    on meds   Dermatophytosis of nail    Essential hypertension, benign    on meds   Hyperlipidemia    diet controlled-no meds   Menorrhagia    Obesity    Osteopenia    Other and unspecified hyperlipidemia    Pain in joint, ankle and foot    Unspecified vitamin D deficiency     Past Surgical History:  Procedure Laterality Date   APPENDECTOMY     COLONOSCOPY  2012   WNL-Dr.Peters   MENISCUS REPAIR Left 2019   TONSILLECTOMY      Family History  Problem Relation Age of Onset   Lung disease Mother    Osteoporosis Mother    Colon polyps Mother 43   CVA Father    Heart disease Brother    Diabetes Maternal Aunt    Breast cancer Maternal Aunt    Diabetes Maternal Uncle    Aneurysm Sister    Breast cancer Cousin     Esophageal cancer Neg Hx    Colon cancer Neg Hx    Stomach cancer Neg Hx    Rectal cancer Neg Hx     Social History   Socioeconomic History   Marital status: Married    Spouse name: DENNIS   Number of children: 2   Years of education: 14+   Highest education level: Not on file  Occupational History   Occupation: TEACHER    Employer: OTHER  Tobacco Use   Smoking status: Never   Smokeless tobacco: Never  Vaping Use   Vaping Use: Never used  Substance and Sexual Activity   Alcohol use: Yes    Alcohol/week: 3.0 standard drinks    Types: 3 Standard drinks or equivalent per week    Comment: occ   Drug use: No   Sexual activity: Not on file  Other Topics Concern   Not on file  Social History Narrative   Marital Status: Married [name])   Children:  Son [name])  Daughter [name])    Pets:  Dog    Living Situation: Lives with spouse and children  Occupation: Pharmacist, 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    Education:  Forensic psychologist ([employer])    Alcohol Use:  Occasional   Drug Use:  None   Diet:  Regular   Exercise:  Walking   Hobbies: Environmental education officer, Reading, Actor              Social Determinants of Radio broadcast assistant Strain: Not on file  Food Insecurity: Not on file  Transportation Needs: Not on file  Physical Activity: Not on file  Stress: Not on file  Social Connections: Not on file  Intimate Partner Violence: Not on file    Outpatient Medications Prior to Visit  Medication Sig Dispense Refill   atorvastatin (LIPITOR) 10 MG tablet      buPROPion (WELLBUTRIN XL) 300 MG 24 hr tablet      celecoxib (CELEBREX) 200 MG capsule Take 1 capsule by mouth daily.     estradiol (ESTRACE) 0.1 MG/GM vaginal cream daily as needed.     Fluoxetine HCl, PMDD, 20 MG TABS Take 1 tablet by mouth every other day.     Triamterene-HCTZ (MAXZIDE PO) Take 1 tablet by mouth daily.     FLUoxetine (PROZAC) 10 MG capsule      No facility-administered medications prior  to visit.    No Known Allergies  ROS Review of Systems  Constitutional: Negative.   HENT: Negative.    Eyes:  Negative for photophobia and visual disturbance.  Respiratory: Negative.    Cardiovascular: Negative.   Gastrointestinal: Negative.   Genitourinary: Negative.   Neurological:  Negative for speech difficulty and weakness.  Psychiatric/Behavioral: Negative.     Depression screen [facility] 2/9 05/18/2021 05/18/2021  Decreased Interest 0 0  Down, Depressed, Hopeless 0 0  PHQ - 2 Score 0 0  Altered sleeping 0 -  Tired, decreased energy 0 -  Change in appetite 0 -  Feeling bad or failure about yourself  0 -  Trouble concentrating 0 -  Moving slowly or fidgety/restless 0 -  Suicidal thoughts 0 -  PHQ-9 Score 0 -   GAD 7 : Generalized Anxiety Score 05/18/2021  Nervous, Anxious, on Edge 0  Control/stop worrying 0  Worry too much - different things 0  Trouble relaxing 0  Restless 0  Easily annoyed or irritable 0  Afraid - awful might happen 0  Total GAD 7 Score 0        Objective:    Physical Exam Vitals and nursing note reviewed.  Constitutional:      General: She is not in acute distress.    Appearance: Normal appearance. She is not ill-appearing, toxic-appearing or diaphoretic.  HENT:     Head: Atraumatic.     Right Ear: Tympanic membrane, ear canal and external ear normal.     Left Ear: Tympanic membrane, ear canal and external ear normal.     Mouth/Throat:     Mouth: Mucous membranes are moist.     Pharynx: Oropharynx is clear. No oropharyngeal exudate or posterior oropharyngeal erythema.  Eyes:     General: No scleral icterus.       Right eye: No discharge.        Left eye: No discharge.     Extraocular Movements: Extraocular movements intact.     Conjunctiva/sclera: Conjunctivae normal.     Pupils: Pupils are equal, round, and reactive to light.  Neck:     Vascular: No carotid bruit.  Cardiovascular:     Rate and Rhythm: Normal rate and  regular rhythm.   Pulmonary:     Effort: Pulmonary effort is normal.     Breath sounds: Normal breath sounds.  Abdominal:     General: Bowel sounds are normal.  Musculoskeletal:     Cervical back: No rigidity or tenderness.  Lymphadenopathy:     Cervical: No cervical adenopathy.  Skin:    General: Skin is warm and dry.     Coloration: Skin is not jaundiced.  Neurological:     Mental Status: She is alert and oriented to person, place, and time.    BP 132/76 (BP Location: Right Arm, Patient Position: Sitting, Cuff Size: Large)   Pulse 84   Temp (!) 97 F (36.1 C) (Temporal)   Ht '5\' 3"'$  (1.6 m)   Wt 207 lb 9.6 oz (94.2 kg)   LMP 10/28/2013   SpO2 96%   BMI 36.77 kg/m  Wt Readings from Last 3 Encounters:  05/18/21 207 lb 9.6 oz (94.2 kg)  10/23/20 200 lb (90.7 kg)  10/09/20 200 lb (90.7 kg)     Health Maintenance Due  Topic Date Due   HIV Screening  Never done   Zoster Vaccines- Shingrix (1 of 2) Never done   INFLUENZA VACCINE  04/19/2021    There are no preventive care reminders to display for this patient.  Lab Results  Component Value Date   TSH 1.688 12/23/2013   Lab Results  Component Value Date   WBC 8.2 12/23/2013   HGB 15.0 12/23/2013   HCT 43.3 12/23/2013   MCV 78.7 12/23/2013   PLT 221 12/23/2013   Lab Results  Component Value Date   NA 136 12/23/2013   K 4.4 12/23/2013   CO2 23 12/23/2013   GLUCOSE 86 12/23/2013   BUN 14 12/23/2013   CREATININE 0.70 12/23/2013   BILITOT 0.6 12/23/2013   ALKPHOS 73 12/23/2013   AST 15 12/23/2013   ALT 20 12/23/2013   PROT 6.7 12/23/2013   ALBUMIN 4.2 12/23/2013   CALCIUM 8.9 12/23/2013   Lab Results  Component Value Date   CHOL 144 12/23/2013   Lab Results  Component Value Date   HDL 39 (L) 12/23/2013   Lab Results  Component Value Date   LDLCALC 83 12/23/2013   Lab Results  Component Value Date   TRIG 111 12/23/2013   Lab Results  Component Value Date   CHOLHDL 3.7 12/23/2013   No results found for:  HGBA1C    Assessment & Plan:   Problem List Items Addressed This Visit       Other   Hepatitis B core antibody positive - Primary   Relevant Orders   Hepatitis B Surface AntiGEN   Hepatitis B Core Antibody, IgM   Hepatitis B DNA, ultraquantitative, PCR   Hepatitis B Core Antibody, total   Hepatitis B surface antibody,qualitative   Elevated cholesterol   Relevant Medications   atorvastatin (LIPITOR) 10 MG tablet   Other Visit Diagnoses     Healthcare maintenance       Relevant Orders   Varicella-zoster vaccine IM (Shingrix)       No orders of the defined types were placed in this encounter.   Follow-up: Return in about 3 months (around 08/18/2021), or if symptoms worsen or fail to improve.  Return in 3 months to follow-up on elevated cholesterol treatment with atorvastatin.  Will give second Shingrix vaccine at that time as well.  Suspect false positive hep B core antibody test through [facility].  Libby Maw,  MD

## 2021-05-19 LAB — SPECIMEN STATUS REPORT

## 2021-05-20 LAB — HEPATITIS B SURFACE ANTIGEN: Hepatitis B Surface Ag: NONREACTIVE

## 2021-05-20 LAB — HEPATITIS B DNA, ULTRAQUANTITATIVE, PCR
Hepatitis B DNA (Calc): <1 Log IU/mL
Hepatitis B DNA: <10 IU/mL

## 2021-05-20 LAB — HEPATITIS B CORE ANTIBODY, TOTAL: Hep B Core Total Ab: NONREACTIVE

## 2021-05-20 LAB — HEPATITIS B SURFACE ANTIBODY,QUALITATIVE: Hep B S Ab: NONREACTIVE

## 2021-05-20 LAB — HEPATITIS B CORE ANTIBODY, IGM: Hep B C IgM: NONREACTIVE

## 2021-06-01 ENCOUNTER — Ambulatory Visit: Payer: BC Managed Care – PPO

## 2021-06-14 ENCOUNTER — Ambulatory Visit: Payer: BC Managed Care – PPO

## 2021-07-20 ENCOUNTER — Other Ambulatory Visit: Payer: Self-pay

## 2021-07-20 ENCOUNTER — Ambulatory Visit (INDEPENDENT_AMBULATORY_CARE_PROVIDER_SITE_OTHER): Payer: BC Managed Care – PPO

## 2021-07-20 ENCOUNTER — Encounter: Payer: Self-pay | Admitting: Family Medicine

## 2021-07-20 DIAGNOSIS — Z23 Encounter for immunization: Secondary | ICD-10-CM

## 2021-07-20 NOTE — Progress Notes (Signed)
Per orders of Dr. Ethelene Hal, pt is here for influenza vaccine, pt received injection in the right deltoid, given IM by Leonor Liv, CMA. Pt tolerated injection well. Pt instructed to report any adverse reactions to me immediately.

## 2021-08-12 IMAGING — MR MR HEAD WO/W CM
14 series · 48 of 48 positions shown · IV contrast (multihance)
Comparison: MR head without and with contrast 09/19/2019 and
03/18/2020

CLINICAL DATA: Benign neoplasm of the meninges. Personal history of
2 meningiomas.

EXAM:
MRI HEAD WITHOUT AND WITH CONTRAST
TECHNIQUE: Multiplanar, multiecho pulse sequences of the brain and surrounding
structures were obtained without and with intravenous contrast.
CONTRAST:  20mL MULTIHANCE GADOBENATE DIMEGLUMINE 529 MG/ML IV SOLN

[Series 2: T1 · sagittal · 5.0mm · 0.45mm/px · 2 of 25 slices shown]
[im 1/25]
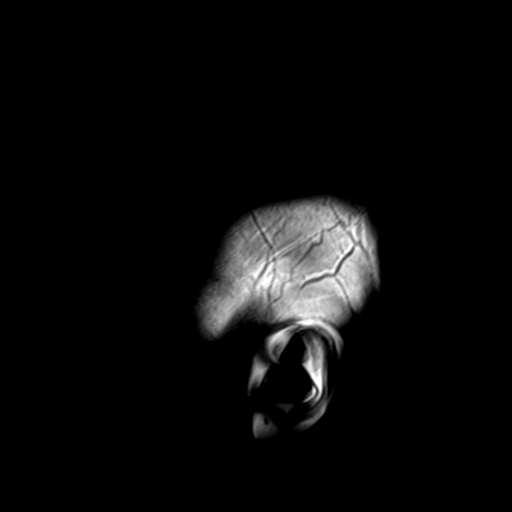
[im 25/25]
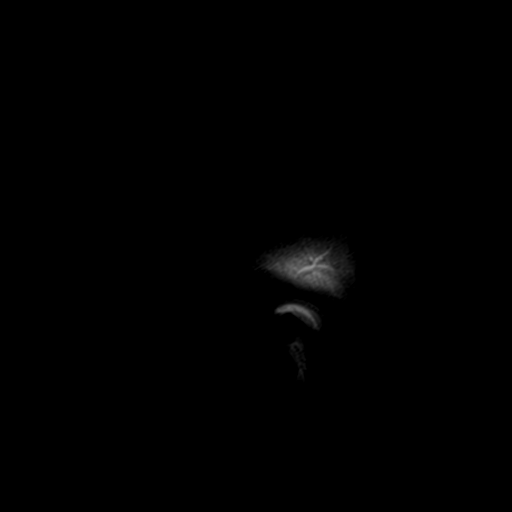

[Series 3: ax ep2d_diff_3 · axial · 3.0mm · 1.80mm/px · z∈[-55,+104]mm · 6 of 107 slices shown]
[im 1/107]
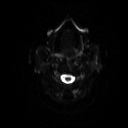
[im 22/107]
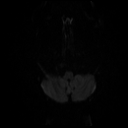
[im 43/107]
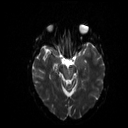
[im 64/107]
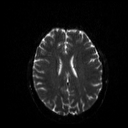
[im 85/107]
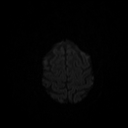
[im 107/107]
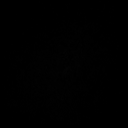

[Series 4: ax ep2d_diff_3_adc · axial · 3.0mm · 1.80mm/px · z∈[-55,+104]mm · 3 of 55 slices shown]
[im 1/55]
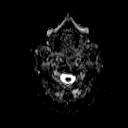
[im 28/55]
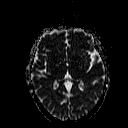
[im 55/55]
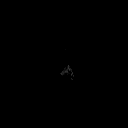

[Series 5: cor ep2d_diff · coronal · 5.0mm · 1.77mm/px · 3 of 49 slices shown]
[im 1/49]
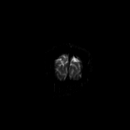
[im 25/49]
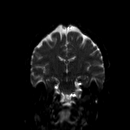
[im 49/49]
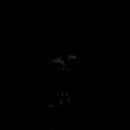

[Series 6: cor ep2d_diff_adc · coronal · 5.0mm · 1.77mm/px · 1 of 25 slices shown]
[im 1/25]
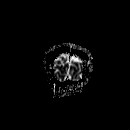

[Series 8: swi_images · axial · 2.0mm · 0.98mm/px · z∈[-51,+104]mm · 5 of 80 slices shown]
[im 1/80]
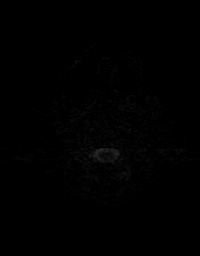
[im 20/80]
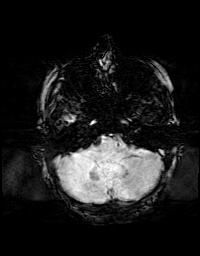
[im 40/80]
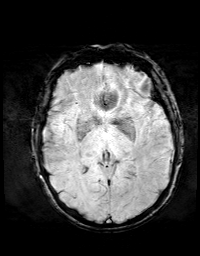
[im 60/80]
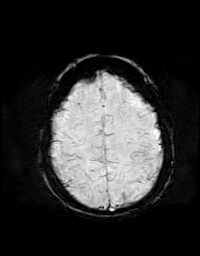
[im 80/80]
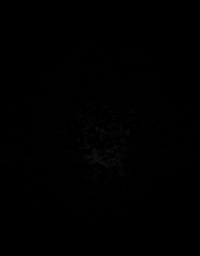

[Series 9: FLAIR · axial · 3.0mm · 0.43mm/px · z∈[-51,+98]mm · 2 of 40 slices shown (1 of 2)]
[im 1/40]
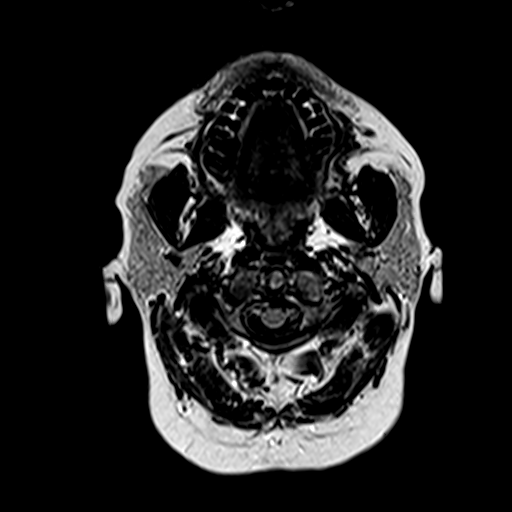
[im 40/40]
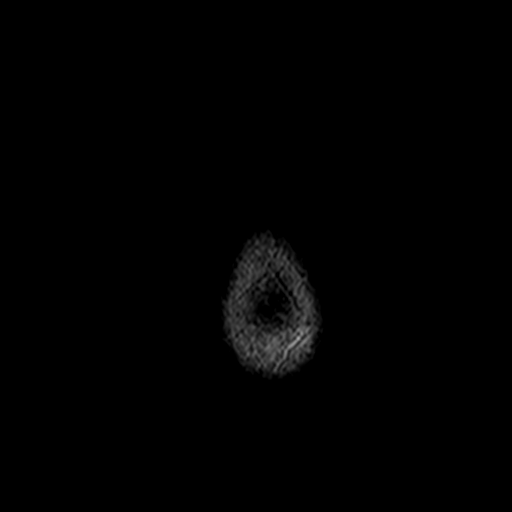

[Series 10: T2 · axial · 5.0mm · 0.65mm/px · z∈[-56,+109]mm · 2 of 29 slices shown (1 of 2)]
[im 1/29]
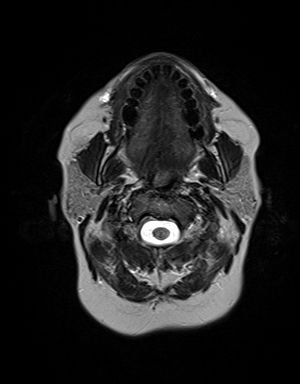
[im 29/29]
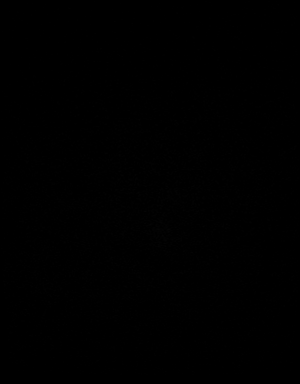

[Series 11: t1_mpr_tra · axial · 1.0mm · 0.72mm/px · z∈[-53,+103]mm · 9 of 160 slices shown]
[im 1/160]
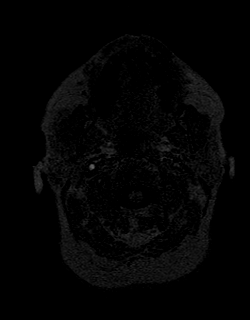
[im 20/160]
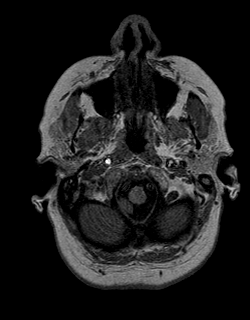
[im 40/160]
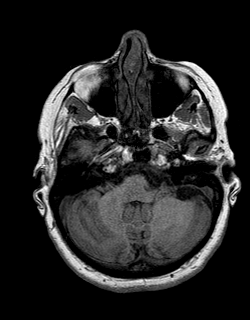
[im 60/160]
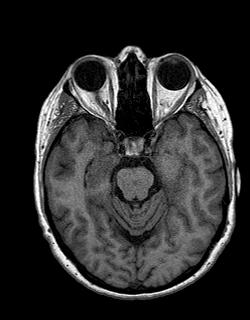
[im 80/160]
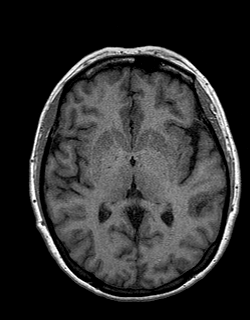
[im 100/160]
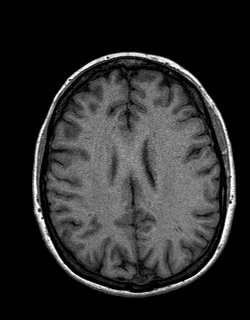
[im 120/160]
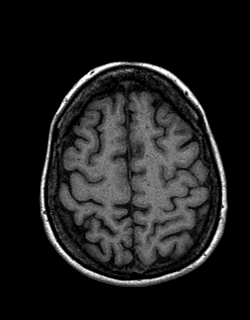
[im 140/160]
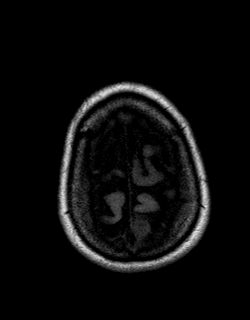
[im 160/160]
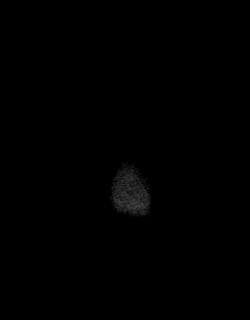

[Series 12: FLAIR · sagittal · 3.0mm · 0.43mm/px · 2 of 40 slices shown (2 of 2)]
[im 1/40]
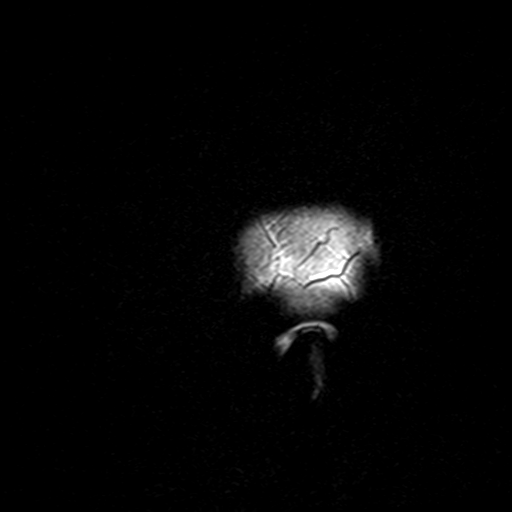
[im 40/40]
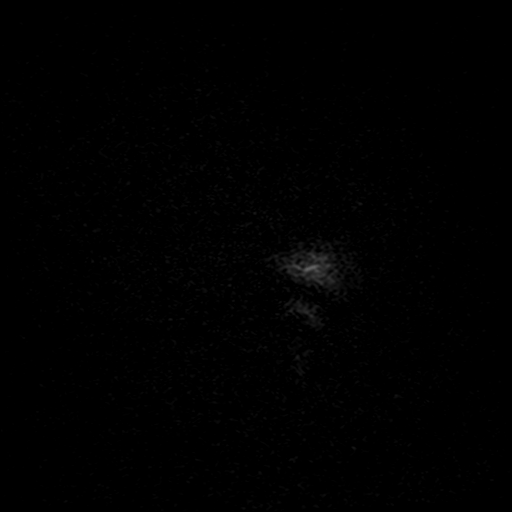

[Series 13: T2 · coronal · 5.0mm · 0.45mm/px · 2 of 28 slices shown (2 of 2)]
[im 1/28]
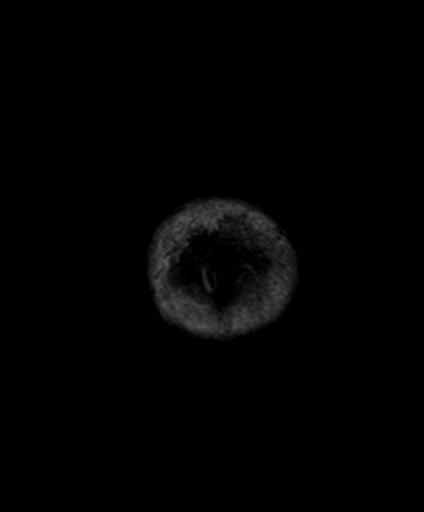
[im 28/28]
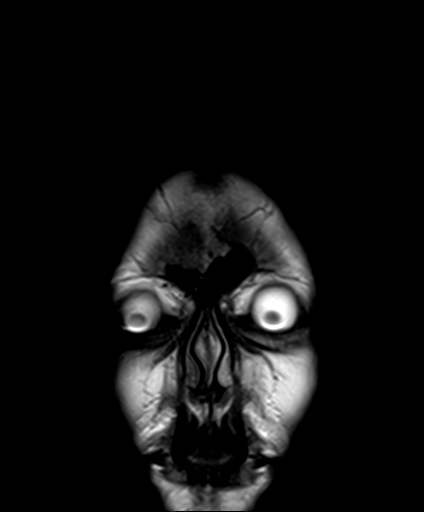

[Series 14: T1 post-contrast · coronal · 5.0mm · 0.72mm/px · 1 of 26 slices shown]
[im 1/26]
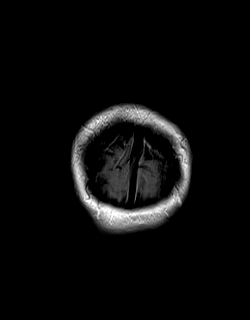

[Series 15: post t1_mpr_tra · axial · 1.0mm · 0.72mm/px · z∈[-53,+103]mm · 9 of 160 slices shown]
[im 1/160]
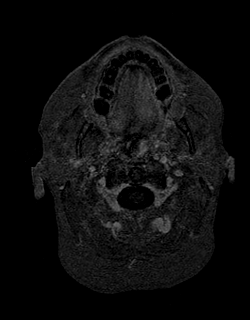
[im 20/160]
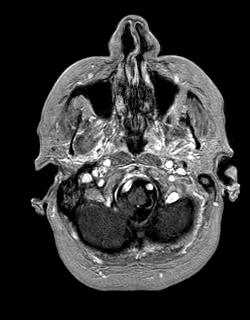
[im 40/160]
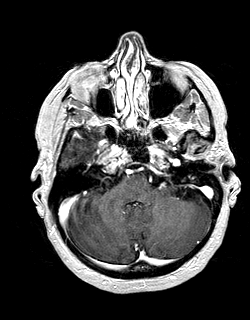
[im 60/160]
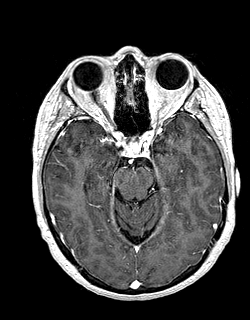
[im 80/160]
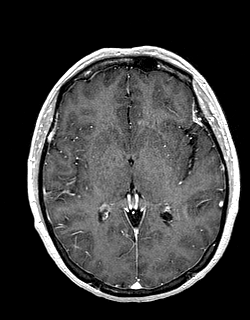
[im 100/160]
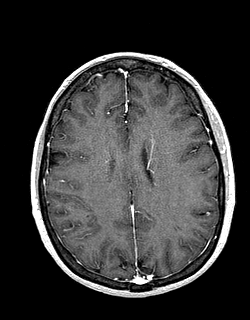
[im 120/160]
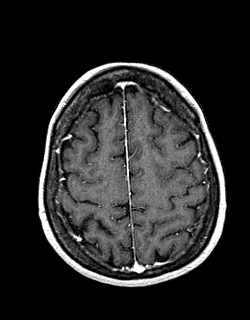
[im 140/160]
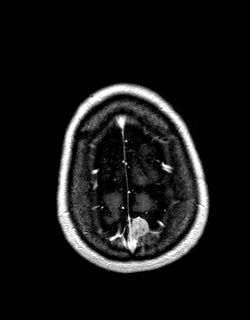
[im 160/160]
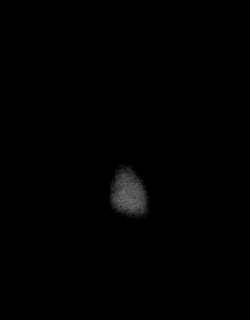

[Series 16: t1_se_sag post · sagittal · 5.0mm · 0.45mm/px · 1 of 25 slices shown]
[im 1/25]
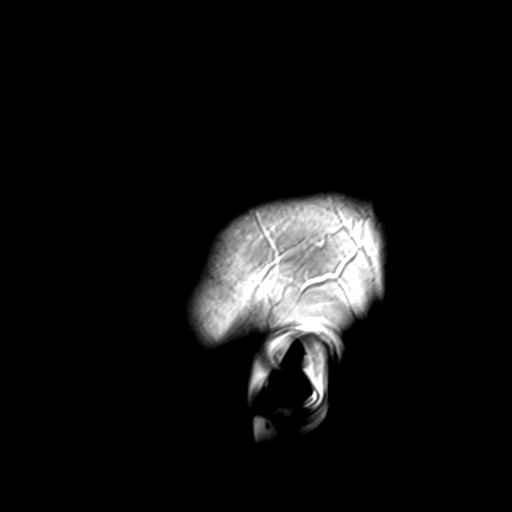

[48 of 48 positions shown; findings below may reference images not displayed]

FINDINGS: Brain: At the previously noted parasagittal meningioma is are again
seen. The larger is more posterior and on the left, measuring 12 x
12 x 14 mm. A more anterior right parasagittal meningioma has
increased in size, now measuring 7 x 8 x 8 mm. It previously
measured 5 x 6 x 6 mm.

No new lesions are present.

Four or 5 small subcortical T2 hyperintensities are stable, within
normal limits for age. No acute infarct, hemorrhage, or other mass
lesion is present. The ventricles are of normal size. No significant
extraaxial fluid collection is present.

The internal auditory canals are within normal limits. The brainstem
and cerebellum are within normal limits. No other pathologic
enhancement is present.

Vascular: Flow is present in the major intracranial arteries.

Skull and upper cervical spine: The craniocervical junction is
normal. Upper cervical spine is within normal limits. Marrow signal
is unremarkable.

Sinuses/Orbits: Bilateral mastoid effusions are present. No
obstructing nasopharyngeal lesion is present. The paranasal sinuses
and mastoid air cells are otherwise clear. The globes and orbits are
within normal limits.
IMPRESSION: 1. Slight increase in size of a right parasagittal meningioma.
2. Otherwise normal MRI appearance of the brain for age.
3. Bilateral mastoid effusions. No obstructing nasopharyngeal lesion
is present.

## 2021-08-20 ENCOUNTER — Ambulatory Visit: Payer: BC Managed Care – PPO | Admitting: Family Medicine

## 2021-08-23 ENCOUNTER — Ambulatory Visit: Payer: BC Managed Care – PPO | Admitting: Family Medicine

## 2021-09-01 ENCOUNTER — Other Ambulatory Visit: Payer: Self-pay

## 2021-09-02 ENCOUNTER — Ambulatory Visit: Payer: BC Managed Care – PPO | Admitting: Family Medicine

## 2021-09-02 ENCOUNTER — Encounter: Payer: Self-pay | Admitting: Family Medicine

## 2021-09-02 VITALS — BP 130/88 | HR 84 | Temp 95.8°F | Ht 63.0 in | Wt 202.2 lb

## 2021-09-02 DIAGNOSIS — E7800 Pure hypercholesterolemia, unspecified: Secondary | ICD-10-CM

## 2021-09-02 DIAGNOSIS — F418 Other specified anxiety disorders: Secondary | ICD-10-CM

## 2021-09-02 DIAGNOSIS — Z23 Encounter for immunization: Secondary | ICD-10-CM | POA: Diagnosis not present

## 2021-09-02 NOTE — Progress Notes (Signed)
Established Patient Office Visit  Subjective:  Patient ID: Emily Stephens, female    DOB: 1961/02/24  Age: 60 y.o. MRN: [id number]  CC:  Chief Complaint  Patient presents with   Follow-up    3 mo f/u    HPI Emily Stephens presents for for follow-up of hyperlipidemia treated with atorvastatin and for depression with fluoxetine and Wellbutrin.  This is been a good combination for her for some years now.  Feels as though her mood is elevated but stable.  She is having no issues taking atorvastatin.  Continues to enjoy retirement from teaching.  Exercises by walking.  Here for second Shingrix vaccine today.  Past Medical History:  Diagnosis Date   Allergy    seasonal allergies   Arthritis    bilateral knees   Bone spur    Right Foot   Cataract    not a surgical candidate at this time (10/09/2020)   Depression    on meds   Dermatophytosis of nail    Essential hypertension, benign    on meds   Hyperlipidemia    diet controlled-no meds   Menorrhagia    Obesity    Osteopenia    Other and unspecified hyperlipidemia    Pain in joint, ankle and foot    Unspecified vitamin D deficiency     Past Surgical History:  Procedure Laterality Date   APPENDECTOMY     COLONOSCOPY  2012   WNL-Dr.Peters   MENISCUS REPAIR Left 2019   TONSILLECTOMY      Family History  Problem Relation Age of Onset   Lung disease Mother    Osteoporosis Mother    Colon polyps Mother 35   CVA Father    Heart disease Brother    Diabetes Maternal Aunt    Breast cancer Maternal Aunt    Diabetes Maternal Uncle    Aneurysm Sister    Breast cancer Cousin    Esophageal cancer Neg Hx    Colon cancer Neg Hx    Stomach cancer Neg Hx    Rectal cancer Neg Hx     Social History   Socioeconomic History   Marital status: Married    Spouse name: DENNIS   Number of children: 2   Years of education: 14+   Highest education level: Not on file  Occupational History   Occupation: TEACHER    Employer:  [employer]  Tobacco Use   Smoking status: Never   Smokeless tobacco: Never  Vaping Use   Vaping Use: Never used  Substance and Sexual Activity   Alcohol use: Yes    Alcohol/week: 3.0 standard drinks    Types: 3 Standard drinks or equivalent per week    Comment: occ   Drug use: No   Sexual activity: Not on file  Other Topics Concern   Not on file  Social History Narrative   Marital Status: Married [name])   Children:  Son Press photographer)  Daughter [name])    Pets:  Dog    Living Situation: Lives with spouse and children    Occupation: Pharmacist, 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    Education:  Forensic psychologist ([employer])    Alcohol Use:  Occasional   Drug Use:  None   Diet:  Regular   Exercise:  Walking   Hobbies: Environmental education officer, Reading, Actor              Social Determinants of Radio broadcast assistant Strain: Not on file  Food Insecurity: Not on  file  Transportation Needs: Not on file  Physical Activity: Not on file  Stress: Not on file  Social Connections: Not on file  Intimate Partner Violence: Not on file    Outpatient Medications Prior to Visit  Medication Sig Dispense Refill   atorvastatin (LIPITOR) 10 MG tablet      buPROPion (WELLBUTRIN XL) 300 MG 24 hr tablet      celecoxib (CELEBREX) 200 MG capsule Take 1 capsule by mouth daily.     estradiol (ESTRACE) 0.1 MG/GM vaginal cream daily as needed.     Fluoxetine HCl, PMDD, 20 MG TABS Take 1 tablet by mouth every other day.     Triamterene-HCTZ (MAXZIDE PO) Take 1 tablet by mouth daily.     pravastatin (PRAVACHOL) 20 MG tablet Take 20 mg by mouth daily as needed.     No facility-administered medications prior to visit.    No Known Allergies  ROS Review of Systems  Constitutional:  Negative for chills, diaphoresis, fatigue, fever and unexpected weight change.  HENT: Negative.    Eyes:  Negative for photophobia and visual disturbance.  Respiratory: Negative.    Cardiovascular: Negative.    Gastrointestinal: Negative.   Endocrine: Negative for polyphagia and polyuria.  Genitourinary: Negative.   Neurological:  Negative for speech difficulty and weakness.     Depression screen [facility] 2/9 05/18/2021 05/18/2021  Decreased Interest 0 0  Down, Depressed, Hopeless 0 0  PHQ - 2 Score 0 0  Altered sleeping 0 -  Tired, decreased energy 0 -  Change in appetite 0 -  Feeling bad or failure about yourself  0 -  Trouble concentrating 0 -  Moving slowly or fidgety/restless 0 -  Suicidal thoughts 0 -  PHQ-9 Score 0 -     Objective:    Physical Exam Vitals and nursing note reviewed.  Constitutional:      General: She is not in acute distress.    Appearance: Normal appearance. She is not ill-appearing, toxic-appearing or diaphoretic.  HENT:     Head: Normocephalic and atraumatic.     Right Ear: Tympanic membrane, ear canal and external ear normal.     Left Ear: Tympanic membrane, ear canal and external ear normal.     Mouth/Throat:     Mouth: Mucous membranes are moist.     Pharynx: Oropharynx is clear. No oropharyngeal exudate or posterior oropharyngeal erythema.  Eyes:     General: No scleral icterus.       Right eye: No discharge.        Left eye: No discharge.     Extraocular Movements: Extraocular movements intact.     Conjunctiva/sclera: Conjunctivae normal.     Pupils: Pupils are equal, round, and reactive to light.  Cardiovascular:     Rate and Rhythm: Normal rate and regular rhythm.  Pulmonary:     Effort: Pulmonary effort is normal.     Breath sounds: Normal breath sounds.  Musculoskeletal:     Cervical back: No rigidity or tenderness.  Lymphadenopathy:     Cervical: No cervical adenopathy.  Skin:    General: Skin is warm and dry.  Neurological:     Mental Status: She is alert and oriented to person, place, and time.  Psychiatric:        Mood and Affect: Mood normal.        Behavior: Behavior normal.    BP 130/88 (BP Location: Right Arm, Patient  Position: Sitting, Cuff Size: Large)    Pulse 84  Temp (!) 95.8 F (35.4 C) (Temporal)    Ht 5\' 3"  (1.6 m)    Wt 202 lb 3.2 oz (91.7 kg)    LMP 10/28/2013    SpO2 97%    BMI 35.82 kg/m  Wt Readings from Last 3 Encounters:  09/02/21 202 lb 3.2 oz (91.7 kg)  05/18/21 207 lb 9.6 oz (94.2 kg)  10/23/20 200 lb (90.7 kg)     Health Maintenance Due  Topic Date Due   HIV Screening  Never done    There are no preventive care reminders to display for this patient.  Lab Results  Component Value Date   TSH 1.688 12/23/2013   Lab Results  Component Value Date   WBC 8.2 12/23/2013   HGB 15.0 12/23/2013   HCT 43.3 12/23/2013   MCV 78.7 12/23/2013   PLT 221 12/23/2013   Lab Results  Component Value Date   NA 136 12/23/2013   K 4.4 12/23/2013   CO2 23 12/23/2013   GLUCOSE 86 12/23/2013   BUN 14 12/23/2013   CREATININE 0.70 12/23/2013   BILITOT 0.6 12/23/2013   ALKPHOS 73 12/23/2013   AST 15 12/23/2013   ALT 20 12/23/2013   PROT 6.7 12/23/2013   ALBUMIN 4.2 12/23/2013   CALCIUM 8.9 12/23/2013   Lab Results  Component Value Date   CHOL 144 12/23/2013   Lab Results  Component Value Date   HDL 39 (L) 12/23/2013   Lab Results  Component Value Date   LDLCALC 83 12/23/2013   Lab Results  Component Value Date   TRIG 111 12/23/2013   Lab Results  Component Value Date   CHOLHDL 3.7 12/23/2013   No results found for: HGBA1C    Assessment & Plan:   Problem List Items Addressed This Visit       Other   Elevated cholesterol   Relevant Medications   pravastatin (PRAVACHOL) 20 MG tablet   Other Relevant Orders   Comprehensive metabolic panel   Lipid panel   Depression with anxiety   Relevant Orders   CBC   Need for shingles vaccine - Primary   Relevant Orders   Varicella-zoster vaccine IM (Shingrix) (Completed)    No orders of the defined types were placed in this encounter.   Follow-up: Return in about 6 months (around 03/03/2022), or Return fasting for  above ordered blood work.Libby Maw, MD

## 2021-09-06 ENCOUNTER — Other Ambulatory Visit (INDEPENDENT_AMBULATORY_CARE_PROVIDER_SITE_OTHER): Payer: BC Managed Care – PPO

## 2021-09-06 ENCOUNTER — Other Ambulatory Visit: Payer: Self-pay

## 2021-09-06 DIAGNOSIS — E7800 Pure hypercholesterolemia, unspecified: Secondary | ICD-10-CM

## 2021-09-06 DIAGNOSIS — F418 Other specified anxiety disorders: Secondary | ICD-10-CM | POA: Diagnosis not present

## 2021-09-06 LAB — LIPID PANEL
Cholesterol: 177 mg/dL (ref 0–200)
HDL: 58.1 mg/dL (ref >39.00)
LDL Cholesterol: 92 mg/dL (ref 0–99)
NonHDL: 118.5
Total CHOL/HDL Ratio: 3
Triglycerides: 132 mg/dL (ref 0.0–149.0)
VLDL: 26.4 mg/dL (ref 0.0–40.0)

## 2021-09-06 LAB — CBC
HCT: 45.3 % (ref 36.0–46.0)
Hemoglobin: 15 g/dL (ref 12.0–15.0)
MCHC: 33 g/dL (ref 30.0–36.0)
MCV: 80.6 fl (ref 78.0–100.0)
Platelets: 250 10*3/uL (ref 150.0–400.0)
RBC: 5.63 Mil/uL — ABNORMAL HIGH (ref 3.87–5.11)
RDW: 14.6 % (ref 11.5–15.5)
WBC: 7.4 10*3/uL (ref 4.0–10.5)

## 2021-09-06 LAB — COMPREHENSIVE METABOLIC PANEL
ALT: 18 U/L (ref 0–35)
AST: 13 U/L (ref 0–37)
Albumin: 4.3 g/dL (ref 3.5–5.2)
Alkaline Phosphatase: 89 U/L (ref 39–117)
BUN: 23 mg/dL (ref 6–23)
CO2: 28 mEq/L (ref 19–32)
Calcium: 9.9 mg/dL (ref 8.4–10.5)
Chloride: 103 mEq/L (ref 96–112)
Creatinine, Ser: 0.98 mg/dL (ref 0.40–1.20)
GFR: 62.56 mL/min (ref >60.00)
Glucose, Bld: 91 mg/dL (ref 70–99)
Potassium: 4.3 mEq/L (ref 3.5–5.1)
Sodium: 140 mEq/L (ref 135–145)
Total Bilirubin: 0.6 mg/dL (ref 0.2–1.2)
Total Protein: 6.9 g/dL (ref 6.0–8.3)

## 2021-09-07 ENCOUNTER — Telehealth (INDEPENDENT_AMBULATORY_CARE_PROVIDER_SITE_OTHER): Payer: BC Managed Care – PPO | Admitting: Family Medicine

## 2021-09-07 ENCOUNTER — Encounter: Payer: Self-pay | Admitting: Family Medicine

## 2021-09-07 DIAGNOSIS — R0981 Nasal congestion: Secondary | ICD-10-CM

## 2021-09-07 MED ORDER — DOXYCYCLINE HYCLATE 100 MG PO TABS: 100.0000 mg | ORAL_TABLET | Freq: Two times a day (BID) | ORAL | 0 refills | Status: DC

## 2021-09-07 NOTE — Progress Notes (Signed)
Virtual Visit via Telephone Note  I connected with Emily Stephens on 09/07/21 at 11:40 AM EST by telephone and verified that I am speaking with the correct person using two identifiers.   I discussed the limitations, risks, security and privacy concerns of performing an evaluation and management service by telephone and the availability of in person appointments. I also discussed with the patient that there may be a patient responsible charge related to this service. The patient expressed understanding and agreed to proceed.  Location patient: home,  Location provider: work or home office Participants present for the call: patient, provider Patient did not have a visit with me in the prior 7 days to address this/these issue(s).   History of Present Illness:  Acute telemedicine visit for a "sinus infection": -Onset: about 2 weeks ago -Symptoms include: thick nasal congestion sometime with streaks of blood in the mucus on the R side, has had some sinus discomfort/sinus headache as well -Denies:fever, CP, SOB, NVD -Has tried: nasal saline, Tylenol -Pertinent past medical history:see below -Pertinent medication allergies: No Known Allergies -COVID-19 vaccine status: Immunization History  Administered Date(s) Administered   Influenza Split 05/20/2002   Influenza,inj,Quad PF,6+ Mos 10/18/2016, 07/18/2018, 07/20/2021   Influenza-Unspecified 05/20/2002, 10/18/2016, 05/23/2019   PFIZER Comirnaty(Gray Top)Covid-19 Tri-Sucrose Vaccine 11/16/2019, 12/07/2019, 02/17/2021   PFIZER(Purple Top)SARS-COV-2 Vaccination 11/16/2019, 12/07/2019, 06/19/2020   Td 09/19/1997, 09/19/1997   Tdap 09/19/1997, 01/01/2008, 08/06/2019   Zoster Recombinat (Shingrix) 05/18/2021, 09/02/2021   Zoster, Live 06/21/2012     Observations/Objective: Patient sounds cheerful and well on the phone. I do not appreciate any SOB. Speech and thought processing are grossly intact. Patient reported vitals:  Assessment and  Plan:  Nasal congestion  -we discussed possible serious and likely etiologies, options for evaluation and workup, limitations of telemedicine visit vs in person visit, treatment, treatment risks and precautions. Pt prefers to treat via telemedicine empirically rather than in person at this moment. Query sinusitis, VURI, covid19 vs other. She has opted to try nasal saline with initiation of abx if not improving. She also plans to check a covid test.  Advised to seek prompt in person care if worsening, new symptoms arise, or if is not improving with treatment. Advised of options for inperson care in case PCP office not available. Did let the patient know that I only do telemedicine shifts for [place] on Tuesdays and Thursdays and advised a follow up visit with PCP or at an [facility] if has further questions or concerns.   Follow Up Instructions:  I did not refer this patient for an OV with me in the next 24 hours for this/these issue(s).  I discussed the assessment and treatment plan with the patient. The patient was provided an opportunity to ask questions and all were answered. The patient agreed with the plan and demonstrated an understanding of the instructions.   I spent 15 minutes on the date of this visit in the care of this patient. See summary of tasks completed to properly care for this patient in the detailed notes above which also included counseling of above, review of PMH, medications, allergies, evaluation of the patient and ordering and/or  instructing patient on testing and care options.     Lucretia Kern, DO

## 2021-09-07 NOTE — Patient Instructions (Addendum)
-  I sent the medication(s) we discussed to your pharmacy: Meds ordered this encounter  Medications   doxycycline (VIBRA-TABS) 100 MG tablet    Sig: Take 1 tablet (100 mg total) by mouth 2 (two) times daily.    Dispense:  20 tablet    Refill:  0   Nasal saline twice dialy.   I hope you are feeling better soon!  Seek in person care promptly if your symptoms worsen, new concerns arise or you are not improving with treatment.  It was nice to meet you today. I help [facility] out with telemedicine visits on Tuesdays and Thursdays and am happy to help if you need a virtual follow up visit on those days. Otherwise, if you have any concerns or questions following this visit please schedule a follow up visit with your Primary Care office or seek care at a local urgent care clinic to avoid delays in care

## 2021-10-01 ENCOUNTER — Other Ambulatory Visit: Payer: Self-pay | Admitting: Family Medicine

## 2021-10-01 MED ORDER — TRIAMTERENE-HCTZ 75-50 MG PO TABS: 1.0000 | ORAL_TABLET | Freq: Every day | ORAL | 1 refills | Status: DC

## 2021-10-01 NOTE — Telephone Encounter (Signed)
What is the name of the medication? Triamterene-HCTZ (MAXZIDE PO) [[id number]]   Have you contacted your pharmacy to request a refill? She is wanting a refill on this script, she says this script was talked about at her last app.   Which pharmacy would you like this sent to? [id number] [address], [address] AT [facility] OF [address]  [address] [place] [address]  Phone:  [phone]  Fax:  [phone]  DEA #:  [id number]   Patient notified that their request is being sent to the clinical staff for review and that they should receive a call once it is complete. If they do not receive a call within 72 hours they can check with their pharmacy or our office.

## 2021-10-01 NOTE — Telephone Encounter (Signed)
Okay to refill pending Rx? Last OV 09/02/21 Please advise

## 2021-10-04 NOTE — Telephone Encounter (Signed)
Refill sent in

## 2021-11-08 ENCOUNTER — Ambulatory Visit: Payer: BC Managed Care – PPO | Admitting: Family Medicine

## 2021-11-08 ENCOUNTER — Other Ambulatory Visit: Payer: Self-pay

## 2021-11-08 ENCOUNTER — Encounter: Payer: Self-pay | Admitting: Family Medicine

## 2021-11-08 VITALS — BP 136/82 | HR 79 | Temp 97.0°F | Ht 63.0 in | Wt 205.0 lb

## 2021-11-08 DIAGNOSIS — L905 Scar conditions and fibrosis of skin: Secondary | ICD-10-CM | POA: Insufficient documentation

## 2021-11-08 DIAGNOSIS — R5383 Other fatigue: Secondary | ICD-10-CM | POA: Diagnosis not present

## 2021-11-08 DIAGNOSIS — L723 Sebaceous cyst: Secondary | ICD-10-CM | POA: Insufficient documentation

## 2021-11-08 DIAGNOSIS — E7800 Pure hypercholesterolemia, unspecified: Secondary | ICD-10-CM | POA: Diagnosis not present

## 2021-11-08 DIAGNOSIS — L659 Nonscarring hair loss, unspecified: Secondary | ICD-10-CM | POA: Diagnosis not present

## 2021-11-08 MED ORDER — ATORVASTATIN CALCIUM 20 MG PO TABS: 20.0000 mg | ORAL_TABLET | Freq: Every day | ORAL | 3 refills | Status: DC

## 2021-11-08 MED ORDER — LEVOTHYROXINE SODIUM 25 MCG PO TABS: 25.0000 ug | ORAL_TABLET | Freq: Every day | ORAL | 0 refills | Status: DC

## 2021-11-08 NOTE — Progress Notes (Signed)
Established Patient Office Visit  Subjective:  Patient ID: Emily Stephens, female    DOB: 1961/06/29  Age: 61 y.o. MRN: [id number]  CC:  Chief Complaint  Patient presents with   Advice Only    Discuss current medication. Would like Thyroid checked.     HPI Emily Stephens presents for follow-up of elevated ldl cholesterol with a strong family history of vascular disease.  Her ASCVD risk score is low.  She still prefers to take a statin to give her the lowest possible risk of vascular disease.  Ongoing fatigue with cold sensitivity hair loss and intermittent constipation with difficulty losing weight.  TSH was in the normal range 8 months ago.  She has felt this way over the years.  Past Medical History:  Diagnosis Date   Allergy    seasonal allergies   Arthritis    bilateral knees   Bone spur    Right Foot   Cataract    not a surgical candidate at this time (10/09/2020)   Depression    on meds   Dermatophytosis of nail    Essential hypertension, benign    on meds   Hyperlipidemia    diet controlled-no meds   Menorrhagia    Obesity    Osteopenia    Other and unspecified hyperlipidemia    Pain in joint, ankle and foot    Unspecified vitamin D deficiency     Past Surgical History:  Procedure Laterality Date   APPENDECTOMY     COLONOSCOPY  2012   WNL-Dr.Peters   MENISCUS REPAIR Left 2019   TONSILLECTOMY      Family History  Problem Relation Age of Onset   Lung disease Mother    Osteoporosis Mother    Colon polyps Mother 57   CVA Father    Heart disease Brother    Diabetes Maternal Aunt    Breast cancer Maternal Aunt    Diabetes Maternal Uncle    Aneurysm Sister    Breast cancer Cousin    Esophageal cancer Neg Hx    Colon cancer Neg Hx    Stomach cancer Neg Hx    Rectal cancer Neg Hx     Social History   Socioeconomic History   Marital status: Married    Spouse name: DENNIS   Number of children: 2   Years of education: 14+   Highest education  level: Not on file  Occupational History   Occupation: TEACHER    Employer: [employer]  Tobacco Use   Smoking status: Never   Smokeless tobacco: Never  Vaping Use   Vaping Use: Never used  Substance and Sexual Activity   Alcohol use: Yes    Alcohol/week: 3.0 standard drinks    Types: 3 Standard drinks or equivalent per week    Comment: occ   Drug use: No   Sexual activity: Not on file  Other Topics Concern   Not on file  Social History Narrative   Marital Status: Married [name])   Children:  Son Press photographer)  Daughter [name])    Pets:  Dog    Living Situation: Lives with spouse and children    Occupation: Pharmacist, 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    Education:  Forensic psychologist ([employer])    Alcohol Use:  Occasional   Drug Use:  None   Diet:  Regular   Exercise:  Walking   Hobbies: Environmental education officer, Reading, Actor              Social Determinants  of Health   Financial Resource Strain: Not on file  Food Insecurity: Not on file  Transportation Needs: Not on file  Physical Activity: Not on file  Stress: Not on file  Social Connections: Not on file  Intimate Partner Violence: Not on file    Outpatient Medications Prior to Visit  Medication Sig Dispense Refill   buPROPion (WELLBUTRIN XL) 300 MG 24 hr tablet      celecoxib (CELEBREX) 200 MG capsule Take 1 capsule by mouth daily.     Cholecalciferol (VITAMIN D3 PO) Take by mouth daily.     estradiol (ESTRACE) 0.1 MG/GM vaginal cream daily as needed.     FLUoxetine (PROZAC) 20 MG tablet Take 20 mg by mouth daily.     Glucosamine-Chondroitin (OSTEO BI-FLEX REGULAR STRENGTH PO) Take by mouth.     triamterene-hydrochlorothiazide (MAXZIDE) 75-50 MG tablet Take 1 tablet by mouth daily. 90 tablet 1   pravastatin (PRAVACHOL) 20 MG tablet Take 20 mg by mouth daily as needed.     atorvastatin (LIPITOR) 10 MG tablet      doxycycline (VIBRA-TABS) 100 MG tablet Take 1 tablet (100 mg total) by mouth 2 (two) times daily. 20 tablet 0    Fluoxetine HCl, PMDD, 20 MG TABS Take 1 tablet by mouth every other day.     No facility-administered medications prior to visit.    No Known Allergies  ROS Review of Systems  Constitutional:  Positive for fatigue. Negative for chills, diaphoresis, fever and unexpected weight change.  HENT: Negative.    Eyes:  Negative for photophobia and visual disturbance.  Respiratory: Negative.    Cardiovascular: Negative.  Negative for chest pain and palpitations.  Gastrointestinal: Negative.   Endocrine: Positive for cold intolerance.  Genitourinary: Negative.   Neurological: Negative.   Psychiatric/Behavioral: Negative.       Objective:    Physical Exam Vitals and nursing note reviewed.  Constitutional:      General: She is not in acute distress.    Appearance: Normal appearance. She is not ill-appearing, toxic-appearing or diaphoretic.  HENT:     Head: Normocephalic and atraumatic.     Right Ear: External ear normal.     Left Ear: External ear normal.  Eyes:     General: No scleral icterus.       Right eye: No discharge.        Left eye: No discharge.     Extraocular Movements: Extraocular movements intact.     Conjunctiva/sclera: Conjunctivae normal.  Cardiovascular:     Rate and Rhythm: Normal rate and regular rhythm.  Pulmonary:     Effort: Pulmonary effort is normal.  Musculoskeletal:     Cervical back: No rigidity or tenderness.  Lymphadenopathy:     Cervical: No cervical adenopathy.  Skin:    General: Skin is warm and dry.  Neurological:     Mental Status: She is alert and oriented to person, place, and time.  Psychiatric:        Mood and Affect: Mood normal.        Behavior: Behavior normal.    BP 136/82 (BP Location: Right Arm, Patient Position: Sitting, Cuff Size: Large)    Pulse 79    Temp (!) 97 F (36.1 C) (Temporal)    Ht 5\' 3"  (1.6 m)    Wt 205 lb (93 kg)    LMP 10/28/2013    SpO2 96%    BMI 36.31 kg/m  Wt Readings from Last 3 Encounters:  11/08/21 205  lb (93 kg)  09/02/21 202 lb 3.2 oz (91.7 kg)  05/18/21 207 lb 9.6 oz (94.2 kg)     Health Maintenance Due  Topic Date Due   HIV Screening  Never done    There are no preventive care reminders to display for this patient.  Lab Results  Component Value Date   TSH 1.688 12/23/2013   Lab Results  Component Value Date   WBC 7.4 09/06/2021   HGB 15.0 09/06/2021   HCT 45.3 09/06/2021   MCV 80.6 09/06/2021   PLT 250.0 09/06/2021   Lab Results  Component Value Date   NA 140 09/06/2021   K 4.3 09/06/2021   CO2 28 09/06/2021   GLUCOSE 91 09/06/2021   BUN 23 09/06/2021   CREATININE 0.98 09/06/2021   BILITOT 0.6 09/06/2021   ALKPHOS 89 09/06/2021   AST 13 09/06/2021   ALT 18 09/06/2021   PROT 6.9 09/06/2021   ALBUMIN 4.3 09/06/2021   CALCIUM 9.9 09/06/2021   GFR 62.56 09/06/2021   Lab Results  Component Value Date   CHOL 177 09/06/2021   Lab Results  Component Value Date   HDL 58.10 09/06/2021   Lab Results  Component Value Date   LDLCALC 92 09/06/2021   Lab Results  Component Value Date   TRIG 132.0 09/06/2021   Lab Results  Component Value Date   CHOLHDL 3 09/06/2021   No results found for: HGBA1C The 10-year ASCVD risk score (Arnett DK, et al., 2019) is: 4.9%   Values used to calculate the score:     Age: 33 years     Sex: Female     Is Non-Hispanic African American: No     Diabetic: No     Tobacco smoker: No     Systolic Blood Pressure: 562 mmHg     Is BP treated: Yes     HDL Cholesterol: 58.1 mg/dL     Total Cholesterol: 177 mg/dL    Assessment & Plan:   Problem List Items Addressed This Visit       Other   Elevated cholesterol - Primary   Relevant Medications   atorvastatin (LIPITOR) 20 MG tablet   Other fatigue   Relevant Medications   levothyroxine (SYNTHROID) 25 MCG tablet   Hair loss   Relevant Medications   levothyroxine (SYNTHROID) 25 MCG tablet    Meds ordered this encounter  Medications   levothyroxine (SYNTHROID) 25 MCG  tablet    Sig: Take 1 tablet (25 mcg total) by mouth daily. Each morning on a fasting stomach.    Dispense:  90 tablet    Refill:  0   atorvastatin (LIPITOR) 20 MG tablet    Sig: Take 1 tablet (20 mg total) by mouth daily.    Dispense:  90 tablet    Refill:  3    Follow-up: Return in about 3 months (around 02/05/2022), or Please report muscle aches and or rapid heart rate..  Trial of low-dose levothyroxine for symptom relief.  We will plan TSHs.  Prefers to switch over to atorvastatin which is a newer statin.  Follow-up in 3 months for recheck.  Libby Maw, MD

## 2021-11-18 ENCOUNTER — Ambulatory Visit: Payer: BC Managed Care – PPO | Admitting: Family Medicine

## 2021-11-18 ENCOUNTER — Telehealth: Payer: Self-pay | Admitting: Family Medicine

## 2021-11-18 NOTE — Telephone Encounter (Signed)
Patient/Caregiver was notified of No Show/Late Cancellation Policy & possible $62 charge. ?Visit was cancelled with reason "No Show/Cancel within 24 hours" for tracking & charging. ? ?Caller Name: Ebunoluwa Gernert ?Caller Ph #: [phone] ?Date of APPT: 11/18/21 ?Reason given for no show/late cancellation: she is out of town, I took her off the dar wrong, I did not hit NO SHOW 24/CANCEL ?No Show Letter printed & put in outgoing mail (Yes/No): YES ? ?~~~Route message to admin supervisor and clinical team/CMA~~~ ? ? ? ?

## 2021-11-19 ENCOUNTER — Ambulatory Visit (INDEPENDENT_AMBULATORY_CARE_PROVIDER_SITE_OTHER): Payer: BC Managed Care – PPO | Admitting: Family Medicine

## 2021-11-19 ENCOUNTER — Encounter: Payer: Self-pay | Admitting: Family Medicine

## 2021-11-19 ENCOUNTER — Other Ambulatory Visit: Payer: Self-pay

## 2021-11-19 VITALS — BP 118/70 | HR 82 | Temp 98.3°F | Ht 63.0 in | Wt 203.0 lb

## 2021-11-19 DIAGNOSIS — R319 Hematuria, unspecified: Secondary | ICD-10-CM | POA: Diagnosis not present

## 2021-11-19 DIAGNOSIS — N390 Urinary tract infection, site not specified: Secondary | ICD-10-CM

## 2021-11-19 DIAGNOSIS — R300 Dysuria: Secondary | ICD-10-CM

## 2021-11-19 LAB — POCT URINALYSIS DIPSTICK
Glucose, UA: NEGATIVE
Ketones, UA: 5
Nitrite, UA: NEGATIVE
Protein, UA: POSITIVE — AB
Spec Grav, UA: 1.01 (ref 1.010–1.025)
Urobilinogen, UA: 0.2 E.U./dL
pH, UA: 6 (ref 5.0–8.0)

## 2021-11-19 MED ORDER — NITROFURANTOIN MONOHYD MACRO 100 MG PO CAPS: 100.0000 mg | ORAL_CAPSULE | Freq: Two times a day (BID) | ORAL | 0 refills | Status: DC

## 2021-11-19 NOTE — Progress Notes (Addendum)
Established Patient Office Visit  Subjective:  Patient ID: Emily Stephens, female    DOB: 04-03-1961  Age: 61 y.o. MRN: [id number]  CC:  Chief Complaint  Patient presents with   Urinary Tract Infection    Possible UTI, cloudy urine, dysuria, strong smell to urine symptoms x 6 days.     HPI Emily Stephens presents for evaluation of a 2 to 3-day history of dysuria that is worse upon awakening.  Denies urgency but there has been some frequency.  Denies nausea or fever or chills.  No increase in sexual activity.  History of vaginal atrophy has been out of her estradiol cream.  There has been some low back pain.  Last history of UTI was as a teenager.  Past Medical History:  Diagnosis Date   Allergy    seasonal allergies   Arthritis    bilateral knees   Bone spur    Right Foot   Cataract    not a surgical candidate at this time (10/09/2020)   Depression    on meds   Dermatophytosis of nail    Essential hypertension, benign    on meds   Hyperlipidemia    diet controlled-no meds   Menorrhagia    Obesity    Osteopenia    Other and unspecified hyperlipidemia    Pain in joint, ankle and foot    Unspecified vitamin D deficiency     Past Surgical History:  Procedure Laterality Date   APPENDECTOMY     COLONOSCOPY  2012   WNL-Dr.Peters   MENISCUS REPAIR Left 2019   TONSILLECTOMY      Family History  Problem Relation Age of Onset   Lung disease Mother    Osteoporosis Mother    Colon polyps Mother 69   CVA Father    Heart disease Brother    Diabetes Maternal Aunt    Breast cancer Maternal Aunt    Diabetes Maternal Uncle    Aneurysm Sister    Breast cancer Cousin    Esophageal cancer Neg Hx    Colon cancer Neg Hx    Stomach cancer Neg Hx    Rectal cancer Neg Hx     Social History   Socioeconomic History   Marital status: Married    Spouse name: DENNIS   Number of children: 2   Years of education: 14+   Highest education level: Not on file  Occupational  History   Occupation: TEACHER    Employer: [employer]  Tobacco Use   Smoking status: Never   Smokeless tobacco: Never  Vaping Use   Vaping Use: Never used  Substance and Sexual Activity   Alcohol use: Yes    Alcohol/week: 3.0 standard drinks    Types: 3 Standard drinks or equivalent per week    Comment: occ   Drug use: No   Sexual activity: Not on file  Other Topics Concern   Not on file  Social History Narrative   Marital Status: Married [name])   Children:  Son Press photographer)  Daughter [name])    Pets:  Dog    Living Situation: Lives with spouse and children    Occupation: Pharmacist, 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    Education:  Forensic psychologist ([employer])    Alcohol Use:  Occasional   Drug Use:  None   Diet:  Regular   Exercise:  Walking   Hobbies: Environmental education officer, Reading, Actor              Social Determinants  of Health   Financial Resource Strain: Not on file  Food Insecurity: Not on file  Transportation Needs: Not on file  Physical Activity: Not on file  Stress: Not on file  Social Connections: Not on file  Intimate Partner Violence: Not on file    Outpatient Medications Prior to Visit  Medication Sig Dispense Refill   atorvastatin (LIPITOR) 20 MG tablet Take 1 tablet (20 mg total) by mouth daily. 90 tablet 3   buPROPion (WELLBUTRIN XL) 300 MG 24 hr tablet      celecoxib (CELEBREX) 200 MG capsule Take 1 capsule by mouth daily.     Cholecalciferol (VITAMIN D3 PO) Take by mouth daily.     estradiol (ESTRACE) 0.1 MG/GM vaginal cream daily as needed.     FLUoxetine (PROZAC) 20 MG tablet Take 20 mg by mouth daily.     Glucosamine-Chondroitin (OSTEO BI-FLEX REGULAR STRENGTH PO) Take by mouth.     levothyroxine (SYNTHROID) 25 MCG tablet Take 1 tablet (25 mcg total) by mouth daily. Each morning on a fasting stomach. 90 tablet 0   triamterene-hydrochlorothiazide (MAXZIDE) 75-50 MG tablet Take 1 tablet by mouth daily. 90 tablet 1   No facility-administered medications  prior to visit.    No Known Allergies  ROS Review of Systems  Constitutional: Negative.  Negative for diaphoresis, fatigue, fever and unexpected weight change.  Respiratory: Negative.    Cardiovascular: Negative.   Gastrointestinal: Negative.   Genitourinary:  Positive for dysuria and frequency. Negative for decreased urine volume, difficulty urinating and urgency.  Musculoskeletal:  Positive for back pain.  Neurological:  Negative for speech difficulty and weakness.     Objective:    Physical Exam Vitals and nursing note reviewed.  Constitutional:      General: She is not in acute distress.    Appearance: Normal appearance. She is not ill-appearing, toxic-appearing or diaphoretic.  Eyes:     General:        Right eye: No discharge.        Left eye: No discharge.     Extraocular Movements: Extraocular movements intact.     Conjunctiva/sclera: Conjunctivae normal.  Pulmonary:     Effort: Pulmonary effort is normal.  Skin:    General: Skin is warm and dry.  Neurological:     Mental Status: She is alert and oriented to person, place, and time.  Psychiatric:        Mood and Affect: Mood normal.        Behavior: Behavior normal.    BP 118/70 (BP Location: Right Arm, Patient Position: Sitting, Cuff Size: Large)    Pulse 82    Temp 98.3 F (36.8 C) (Temporal)    Ht 5\' 3"  (1.6 m)    Wt 203 lb (92.1 kg)    LMP 10/28/2013    SpO2 97%    BMI 35.96 kg/m  Wt Readings from Last 3 Encounters:  11/19/21 203 lb (92.1 kg)  11/08/21 205 lb (93 kg)  09/02/21 202 lb 3.2 oz (91.7 kg)     Health Maintenance Due  Topic Date Due   HIV Screening  Never done    There are no preventive care reminders to display for this patient.  Lab Results  Component Value Date   TSH 1.688 12/23/2013   Lab Results  Component Value Date   WBC 7.4 09/06/2021   HGB 15.0 09/06/2021   HCT 45.3 09/06/2021   MCV 80.6 09/06/2021   PLT 250.0 09/06/2021   Lab Results  Component Value Date   NA 140  09/06/2021   K 4.3 09/06/2021   CO2 28 09/06/2021   GLUCOSE 91 09/06/2021   BUN 23 09/06/2021   CREATININE 0.98 09/06/2021   BILITOT 0.6 09/06/2021   ALKPHOS 89 09/06/2021   AST 13 09/06/2021   ALT 18 09/06/2021   PROT 6.9 09/06/2021   ALBUMIN 4.3 09/06/2021   CALCIUM 9.9 09/06/2021   GFR 62.56 09/06/2021   Lab Results  Component Value Date   CHOL 177 09/06/2021   Lab Results  Component Value Date   HDL 58.10 09/06/2021   Lab Results  Component Value Date   LDLCALC 92 09/06/2021   Lab Results  Component Value Date   TRIG 132.0 09/06/2021   Lab Results  Component Value Date   CHOLHDL 3 09/06/2021   No results found for: HGBA1C    Assessment & Plan:   Problem List Items Addressed This Visit   None Visit Diagnoses     Urinary tract infection with hematuria, site unspecified    -  Primary   Relevant Medications   nitrofurantoin, macrocrystal-monohydrate, (MACROBID) 100 MG capsule   sulfamethoxazole-trimethoprim (BACTRIM DS) 800-160 MG tablet   Other Relevant Orders   POCT Urinalysis Dipstick (Completed)   Urinalysis, Routine w reflex microscopic (Completed)   Urine Culture (Completed)   Dysuria       Relevant Medications   nitrofurantoin, macrocrystal-monohydrate, (MACROBID) 100 MG capsule   sulfamethoxazole-trimethoprim (BACTRIM DS) 800-160 MG tablet   Other Relevant Orders   Urinalysis, Routine w reflex microscopic (Completed)   Urine Culture (Completed)       Meds ordered this encounter  Medications   nitrofurantoin, macrocrystal-monohydrate, (MACROBID) 100 MG capsule    Sig: Take 1 capsule (100 mg total) by mouth 2 (two) times daily for 7 days.    Dispense:  14 capsule    Refill:  0   DISCONTD: sulfamethoxazole-trimethoprim (BACTRIM DS) 800-160 MG tablet    Sig: Take 1 tablet by mouth 2 (two) times daily for 7 days.    Dispense:  14 tablet    Refill:  0   sulfamethoxazole-trimethoprim (BACTRIM DS) 800-160 MG tablet    Sig: Take 1 tablet by  mouth 2 (two) times daily for 7 days. Please discontinue Macrobid or initial antibiotic.    Dispense:  14 tablet    Refill:  0    Follow-up: Return if symptoms worsen or fail to improve.    Libby Maw, MD

## 2021-11-22 ENCOUNTER — Encounter: Payer: Self-pay | Admitting: Family Medicine

## 2021-11-22 LAB — URINALYSIS, ROUTINE W REFLEX MICROSCOPIC
Bilirubin Urine: NEGATIVE
Glucose, UA: NEGATIVE
Nitrite: NEGATIVE
Specific Gravity, Urine: 1.016 (ref 1.001–1.035)
WBC, UA: >=60 /HPF — AB (ref 0–5)
pH: 7 (ref 5.0–8.0)

## 2021-11-22 LAB — URINE CULTURE
MICRO NUMBER:: 13085421
SPECIMEN QUALITY:: ADEQUATE

## 2021-11-22 LAB — MICROSCOPIC MESSAGE

## 2021-11-22 MED ORDER — SULFAMETHOXAZOLE-TRIMETHOPRIM 800-160 MG PO TABS: 1.0000 | ORAL_TABLET | Freq: Two times a day (BID) | ORAL | 0 refills | Status: DC

## 2021-11-22 MED ORDER — NITROFURANTOIN MONOHYD MACRO 100 MG PO CAPS: 100.0000 mg | ORAL_CAPSULE | Freq: Two times a day (BID) | ORAL | 0 refills | Status: AC

## 2021-11-22 MED ORDER — SULFAMETHOXAZOLE-TRIMETHOPRIM 800-160 MG PO TABS: 1.0000 | ORAL_TABLET | Freq: Two times a day (BID) | ORAL | 0 refills | Status: AC

## 2021-11-22 NOTE — Telephone Encounter (Signed)
No charge/did not count as no show ?

## 2021-11-22 NOTE — Progress Notes (Signed)
Urine culture was positive.  He grew out on the organism sometimes in kidney stones history of kidney stones?  Organism identified sensitive to the antibiotic that was prescribed.  Please stop this and start the new antibiotic Septra DS.  Please follow-up in a few weeks so that we can recheck your urine.

## 2021-11-22 NOTE — Addendum Note (Signed)
Addended by: Jon Billings on: 11/22/2021 08:03 AM ? ? Modules accepted: Orders ? ?

## 2021-12-08 ENCOUNTER — Encounter: Payer: Self-pay | Admitting: Family Medicine

## 2021-12-08 ENCOUNTER — Ambulatory Visit (INDEPENDENT_AMBULATORY_CARE_PROVIDER_SITE_OTHER): Payer: BC Managed Care – PPO | Admitting: Family Medicine

## 2021-12-08 VITALS — BP 126/70 | HR 72 | Temp 97.3°F | Ht 63.0 in | Wt 203.2 lb

## 2021-12-08 DIAGNOSIS — E7800 Pure hypercholesterolemia, unspecified: Secondary | ICD-10-CM | POA: Diagnosis not present

## 2021-12-08 DIAGNOSIS — N390 Urinary tract infection, site not specified: Secondary | ICD-10-CM | POA: Insufficient documentation

## 2021-12-08 DIAGNOSIS — M1712 Unilateral primary osteoarthritis, left knee: Secondary | ICD-10-CM | POA: Diagnosis not present

## 2021-12-08 DIAGNOSIS — F418 Other specified anxiety disorders: Secondary | ICD-10-CM | POA: Diagnosis not present

## 2021-12-08 DIAGNOSIS — R319 Hematuria, unspecified: Secondary | ICD-10-CM

## 2021-12-08 MED ORDER — FLUOXETINE HCL 20 MG PO TABS: 20.0000 mg | ORAL_TABLET | Freq: Every day | ORAL | 3 refills | Status: DC

## 2021-12-08 MED ORDER — BUPROPION HCL ER (XL) 300 MG PO TB24: 300.0000 mg | ORAL_TABLET | Freq: Every day | ORAL | 3 refills | Status: DC

## 2021-12-08 MED ORDER — CELECOXIB 200 MG PO CAPS: 200.0000 mg | ORAL_CAPSULE | Freq: Every day | ORAL | 1 refills | Status: DC

## 2021-12-08 MED ORDER — PRAVASTATIN SODIUM 40 MG PO TABS: 40.0000 mg | ORAL_TABLET | Freq: Every day | ORAL | 3 refills | Status: DC

## 2021-12-08 NOTE — Progress Notes (Signed)
? ?Established Patient Office Visit ? ?Subjective:  ?Patient ID: Emily Stephens, female    DOB: 15-Aug-1961  Age: 61 y.o. MRN: [id number] ? ?CC:  ?Chief Complaint  ?Patient presents with  ? Follow-up  ?  Follow up on UTI, no concerns.   ? ? ?HPI ?Emily Stephens presents for follow-up of anxiety with depression, elevated cholesterol and recent UTI.  Distant history of renal lithiasis but no recent occurrences.  Symptoms have resolved after switch to Septra.  Developed constipation with atorvastatin and has since restarted Pravachol.  Depression and anxiety have been treated for years with the fluoxetine and bupropion combination it is worked well for her.  She would like to continue it.  History of end-stage osteoarthritis of left knee.  Celebrex and steroid injections have helped ? ?Past Medical History:  ?Diagnosis Date  ? Allergy   ? seasonal allergies  ? Arthritis   ? bilateral knees  ? Bone spur   ? Right Foot  ? Cataract   ? not a surgical candidate at this time (10/09/2020)  ? Depression   ? on meds  ? Dermatophytosis of nail   ? Essential hypertension, benign   ? on meds  ? Hyperlipidemia   ? diet controlled-no meds  ? Menorrhagia   ? Obesity   ? Osteopenia   ? Other and unspecified hyperlipidemia   ? Pain in joint, ankle and foot   ? Unspecified vitamin D deficiency   ? ? ?Past Surgical History:  ?Procedure Laterality Date  ? APPENDECTOMY    ? COLONOSCOPY  2012  ? WNL-Dr.Peters  ? MENISCUS REPAIR Left 2019  ? TONSILLECTOMY    ? ? ?Family History  ?Problem Relation Age of Onset  ? Lung disease Mother   ? Osteoporosis Mother   ? Colon polyps Mother 10  ? CVA Father   ? Heart disease Brother   ? Diabetes Maternal Aunt   ? Breast cancer Maternal Aunt   ? Diabetes Maternal Uncle   ? Aneurysm Sister   ? Breast cancer Cousin   ? Esophageal cancer Neg Hx   ? Colon cancer Neg Hx   ? Stomach cancer Neg Hx   ? Rectal cancer Neg Hx   ? ? ?Social History  ? ?Socioeconomic History  ? Marital status: Married  ?  Spouse  name: DENNIS  ? Number of children: 2  ? Years of education: 14+  ? Highest education level: Not on file  ?Occupational History  ? Occupation: TEACHER  ?  Employer: OTHER  ?Tobacco Use  ? Smoking status: Never  ? Smokeless tobacco: Never  ?Vaping Use  ? Vaping Use: Never used  ?Substance and Sexual Activity  ? Alcohol use: Yes  ?  Alcohol/week: 3.0 standard drinks  ?  Types: 3 Standard drinks or equivalent per week  ?  Comment: occ  ? Drug use: No  ? Sexual activity: Not on file  ?Other Topics Concern  ? Not on file  ?Social History Narrative  ? Marital Status: Married [name])  ? Children:  Son Press photographer)  Daughter [name])   ? Pets:  Dog   ? Living Situation: Lives with spouse and children   ? 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 Alcohol Use:  Occasional  ? Drug Use:  None  ? Diet:  Regular  ? Exercise:  Walking  ? Hobbies: Gardening, Reading, Shopping    ?   ?   ?   ? ?  Social Determinants of Health  ? ?Financial Resource Strain: Not on file  ?Food Insecurity: Not on file  ?Transportation Needs: Not on file  ?Physical Activity: Not on file  ?Stress: Not on file  ?Social Connections: Not on file  ?Intimate Partner Violence: Not on file  ? ? ?Outpatient Medications Prior to Visit  ?Medication Sig Dispense Refill  ? Cholecalciferol (VITAMIN D3 PO) Take by mouth daily.    ? estradiol (ESTRACE) 0.1 MG/GM vaginal cream daily as needed.    ? Glucosamine-Chondroitin (OSTEO BI-FLEX REGULAR STRENGTH PO) Take by mouth.    ? levothyroxine (SYNTHROID) 25 MCG tablet Take 1 tablet (25 mcg total) by mouth daily. Each morning on a fasting stomach. 90 tablet 0  ? triamterene-hydrochlorothiazide (MAXZIDE) 75-50 MG tablet Take 1 tablet by mouth daily. 90 tablet 1  ? buPROPion (WELLBUTRIN XL) 300 MG 24 hr tablet     ? celecoxib (CELEBREX) 200 MG capsule Take 1 capsule by mouth daily.    ? FLUoxetine (PROZAC) 20 MG tablet Take 20 mg by mouth daily.    ? atorvastatin  (LIPITOR) 20 MG tablet Take 1 tablet (20 mg total) by mouth daily. (Patient not taking: Reported on 12/08/2021) 90 tablet 3  ? ?No facility-administered medications prior to visit.  ? ? ?No Known Allergies ? ?ROS ?Review of Systems  ?Constitutional:  Negative for chills, diaphoresis, fatigue, fever and unexpected weight change.  ?Respiratory: Negative.    ?Cardiovascular: Negative.   ?Gastrointestinal: Negative.   ?Genitourinary: Negative.   ?Musculoskeletal:  Positive for arthralgias. Negative for gait problem and joint swelling.  ?Neurological:  Negative for speech difficulty and weakness.  ? ?  ?Objective:  ?  ?Physical Exam ?Constitutional:   ?   General: She is not in acute distress. ?   Appearance: Normal appearance. She is not ill-appearing, toxic-appearing or diaphoretic.  ?HENT:  ?   Head: Normocephalic and atraumatic.  ?   Right Ear: External ear normal.  ?   Left Ear: External ear normal.  ?Eyes:  ?   General: No scleral icterus.    ?   Right eye: No discharge.     ?   Left eye: No discharge.  ?   Extraocular Movements: Extraocular movements intact.  ?   Conjunctiva/sclera: Conjunctivae normal.  ?Pulmonary:  ?   Effort: Pulmonary effort is normal.  ?Skin: ?   General: Skin is warm and dry.  ?Neurological:  ?   Mental Status: She is alert and oriented to person, place, and time.  ?Psychiatric:     ?   Mood and Affect: Mood normal.     ?   Behavior: Behavior normal.  ? ? ?BP 126/70 (BP Location: Right Arm, Patient Position: Sitting, Cuff Size: Large)   Pulse 72   Temp (!) 97.3 ?F (36.3 ?C) (Temporal)   Ht '5\' 3"'$  (1.6 m)   Wt 203 lb 3.2 oz (92.2 kg)   LMP 10/28/2013   SpO2 96%   BMI 36.00 kg/m?  ?Wt Readings from Last 3 Encounters:  ?12/08/21 203 lb 3.2 oz (92.2 kg)  ?11/19/21 203 lb (92.1 kg)  ?11/08/21 205 lb (93 kg)  ? ? ? ?Health Maintenance Due  ?Topic Date Due  ? HIV Screening  Never done  ? ? ?There are no preventive care reminders to display for this patient. ? ?Lab Results  ?Component Value  Date  ? TSH 1.688 12/23/2013  ? ?Lab Results  ?Component Value Date  ? WBC 7.4 09/06/2021  ? HGB  15.0 09/06/2021  ? HCT 45.3 09/06/2021  ? MCV 80.6 09/06/2021  ? PLT 250.0 09/06/2021  ? ?Lab Results  ?Component Value Date  ? NA 140 09/06/2021  ? K 4.3 09/06/2021  ? CO2 28 09/06/2021  ? GLUCOSE 91 09/06/2021  ? BUN 23 09/06/2021  ? CREATININE 0.98 09/06/2021  ? BILITOT 0.6 09/06/2021  ? ALKPHOS 89 09/06/2021  ? AST 13 09/06/2021  ? ALT 18 09/06/2021  ? PROT 6.9 09/06/2021  ? ALBUMIN 4.3 09/06/2021  ? CALCIUM 9.9 09/06/2021  ? GFR 62.56 09/06/2021  ? ?Lab Results  ?Component Value Date  ? CHOL 177 09/06/2021  ? ?Lab Results  ?Component Value Date  ? HDL 58.10 09/06/2021  ? ?Lab Results  ?Component Value Date  ? [place] 92 09/06/2021  ? ?Lab Results  ?Component Value Date  ? TRIG 132.0 09/06/2021  ? ?Lab Results  ?Component Value Date  ? CHOLHDL 3 09/06/2021  ? ?No results found for: HGBA1C ? ?  ?Assessment & Plan:  ? ?Problem List Items Addressed This Visit   ? ?  ? Musculoskeletal and Integument  ? Primary osteoarthritis of left knee  ? Relevant Medications  ? celecoxib (CELEBREX) 200 MG capsule  ?  ? Genitourinary  ? Urinary tract infection with hematuria - Primary  ? Relevant Orders  ? Urinalysis, Routine w reflex microscopic  ?  ? Other  ? Elevated cholesterol  ? Relevant Medications  ? pravastatin (PRAVACHOL) 40 MG tablet  ? Depression with anxiety  ? Relevant Medications  ? buPROPion (WELLBUTRIN XL) 300 MG 24 hr tablet  ? FLUoxetine (PROZAC) 20 MG tablet  ? ? ?Meds ordered this encounter  ?Medications  ? buPROPion (WELLBUTRIN XL) 300 MG 24 hr tablet  ?  Sig: Take 1 tablet (300 mg total) by mouth daily.  ?  Dispense:  90 tablet  ?  Refill:  3  ? celecoxib (CELEBREX) 200 MG capsule  ?  Sig: Take 1 capsule (200 mg total) by mouth daily.  ?  Dispense:  90 capsule  ?  Refill:  1  ? FLUoxetine (PROZAC) 20 MG tablet  ?  Sig: Take 1 tablet (20 mg total) by mouth daily.  ?  Dispense:  90 tablet  ?  Refill:  3  ?  pravastatin (PRAVACHOL) 40 MG tablet  ?  Sig: Take 1 tablet (40 mg total) by mouth daily.  ?  Dispense:  90 tablet  ?  Refill:  3  ? ? ?Follow-up: Return in about 6 months (around 06/10/2022).  ? ?Continue current therapy

## 2021-12-12 ENCOUNTER — Encounter: Payer: Self-pay | Admitting: Family Medicine

## 2022-02-01 ENCOUNTER — Ambulatory Visit (INDEPENDENT_AMBULATORY_CARE_PROVIDER_SITE_OTHER): Payer: BC Managed Care – PPO | Admitting: Family Medicine

## 2022-02-01 ENCOUNTER — Encounter: Payer: Self-pay | Admitting: Family Medicine

## 2022-02-01 VITALS — BP 128/84 | HR 83 | Temp 96.6°F | Ht 63.0 in | Wt 198.4 lb

## 2022-02-01 DIAGNOSIS — H1031 Unspecified acute conjunctivitis, right eye: Secondary | ICD-10-CM | POA: Diagnosis not present

## 2022-02-01 MED ORDER — PATADAY 0.7 % OP SOLN: 1.0000 [drp] | Freq: Two times a day (BID) | OPHTHALMIC | 0 refills | Status: AC

## 2022-02-01 MED ORDER — CIPROFLOXACIN HCL 0.3 % OP SOLN: 2.0000 [drp] | OPHTHALMIC | 0 refills | Status: DC

## 2022-02-01 NOTE — Progress Notes (Signed)
? ?Acute Office Visit ? ?Subjective:  ? ?  ?Patient ID: Emily Stephens, female    DOB: Apr 30, 1961, 61 y.o.   MRN: [id number] ? ?Chief Complaint  ?Patient presents with  ? Acute Visit  ?  Pt c/o irritated eye right and starting w/ left ?Maybe pink eye. X 2 days  ?Use OTC pink eye meds  ? ? ?Conjunctivitis  ?The current episode started 2 days ago. The onset was sudden. The problem occurs continuously. The problem has been unchanged. The problem is moderate. Nothing (tried OTC eye drops) relieves the symptoms. Associated symptoms include eye itching (gritty feeling in eye), eye discharge, eye pain and eye redness. Pertinent negatives include no orthopnea, no fever, no decreased vision, no double vision, no photophobia, no abdominal pain, no constipation, no diarrhea, no nausea, no vomiting, no congestion, no ear discharge, no ear pain, no headaches, no hearing loss, no mouth sores, no rhinorrhea, no sore throat, no stridor, no swollen glands, no muscle aches, no neck pain, no neck stiffness, no cough, no URI, no wheezing, no rash and no diaper rash. There were sick contacts at home (5 month old grand daughter in daycare). She has received no recent medical care.  ? ?Review of Systems  ?Constitutional:  Negative for fever.  ?HENT:  Negative for congestion, ear discharge, ear pain, hearing loss, mouth sores, rhinorrhea and sore throat.   ?Eyes:  Positive for pain, discharge, redness and itching (gritty feeling in eye). Negative for double vision and photophobia.  ?Respiratory:  Negative for cough, wheezing and stridor.   ?Cardiovascular:  Negative for orthopnea.  ?Gastrointestinal:  Negative for abdominal pain, constipation, diarrhea, nausea and vomiting.  ?Musculoskeletal:  Negative for neck pain.  ?Skin:  Negative for rash.  ?Neurological:  Negative for headaches.  ? ? ?   ?Objective:  ?  ?BP 128/84 (BP Location: Left Arm, Patient Position: Sitting, Cuff Size: Normal)   Pulse 83   Temp (!) 96.6 ?F (35.9 ?C)  (Temporal)   Ht '5\' 3"'$  (1.6 m)   Wt 198 lb 6.4 oz (90 kg)   LMP 10/28/2013   SpO2 97%   BMI 35.14 kg/m?  ? ? ?Gen: NAD, resting comfortably ?HEENT: PERRLA, EOMI, injected sclera on right, normal sclera on left ?Skin: warm, dry ?Neuro: grossly normal, moves all extremities ?Psych: Normal affect and thought content ? ? ?No results found for any visits on 02/01/22. ? ? ?   ?Assessment & Plan:  ?Acute conjunctivitis ?Bacterial versus viral ?Trial ciprofloxacin and Pataday ?Return as needed p ?Problem List Items Addressed This Visit   ?None ?Visit Diagnoses   ? ? Acute conjunctivitis of right eye, unspecified acute conjunctivitis type    -  Primary  ? Relevant Medications  ? ciprofloxacin (CILOXAN) 0.3 % ophthalmic solution  ? Olopatadine HCl (PATADAY) 0.7 % SOLN  ? ?  ? ? ?Meds ordered this encounter  ?Medications  ? ciprofloxacin (CILOXAN) 0.3 % ophthalmic solution  ?  Sig: Place 2 drops into the right eye every 2 (two) hours. Administer 1 drop, every 2 hours, while awake, for 2 days. Then 1 drop, every 4 hours, while awake, for the next 5 days.  ?  Dispense:  5 mL  ?  Refill:  0  ? Olopatadine HCl (PATADAY) 0.7 % SOLN  ?  Sig: Apply 1 drop to eye in the morning and at bedtime for 5 days.  ?  Dispense:  2.5 mL  ?  Refill:  0  ? ? ?Return if  symptoms worsen or fail to improve. ? ?Bonnita Hollow, MD ? ? ?

## 2022-02-07 ENCOUNTER — Ambulatory Visit: Payer: BC Managed Care – PPO | Admitting: Family Medicine

## 2022-02-07 ENCOUNTER — Other Ambulatory Visit: Payer: Self-pay | Admitting: Neurosurgery

## 2022-02-07 DIAGNOSIS — D329 Benign neoplasm of meninges, unspecified: Secondary | ICD-10-CM

## 2022-03-03 ENCOUNTER — Ambulatory Visit: Payer: BC Managed Care – PPO | Admitting: Family Medicine

## 2022-03-04 ENCOUNTER — Ambulatory Visit: Payer: BC Managed Care – PPO | Admitting: Family Medicine

## 2022-03-24 ENCOUNTER — Ambulatory Visit
Admission: RE | Admit: 2022-03-24 | Discharge: 2022-03-24 | Disposition: A | Discharge status: Home / Self Care | Payer: BC Managed Care – PPO | Source: Ambulatory Visit | Attending: Neurosurgery | Admitting: Neurosurgery

## 2022-03-24 DIAGNOSIS — D329 Benign neoplasm of meninges, unspecified: Secondary | ICD-10-CM

## 2022-03-24 MED ORDER — GADOBENATE DIMEGLUMINE 529 MG/ML IV SOLN
18.0000 mL | Freq: Once | INTRAVENOUS | Status: AC | PRN
  Administered 2022-03-24: 18 mL via INTRAVENOUS

## 2022-03-31 ENCOUNTER — Other Ambulatory Visit: Payer: Self-pay | Admitting: Family Medicine

## 2022-06-07 ENCOUNTER — Other Ambulatory Visit: Payer: Self-pay | Admitting: Family Medicine

## 2022-06-07 DIAGNOSIS — M1712 Unilateral primary osteoarthritis, left knee: Secondary | ICD-10-CM

## 2022-07-05 ENCOUNTER — Ambulatory Visit: Payer: BC Managed Care – PPO | Admitting: Family Medicine

## 2022-07-05 ENCOUNTER — Telehealth: Payer: Self-pay | Admitting: Family Medicine

## 2022-07-05 NOTE — Telephone Encounter (Signed)
Pt was a no show for an OV with Dr. Ethelene Hal on 07/05/22, I sent a letter.

## 2022-07-11 ENCOUNTER — Ambulatory Visit: Payer: BC Managed Care – PPO | Admitting: Family Medicine

## 2022-07-11 ENCOUNTER — Encounter: Payer: Self-pay | Admitting: Family Medicine

## 2022-07-11 VITALS — BP 130/78 | HR 77 | Temp 98.2°F | Ht 63.0 in | Wt 204.6 lb

## 2022-07-11 DIAGNOSIS — G4733 Obstructive sleep apnea (adult) (pediatric): Secondary | ICD-10-CM | POA: Insufficient documentation

## 2022-07-11 DIAGNOSIS — F418 Other specified anxiety disorders: Secondary | ICD-10-CM

## 2022-07-11 DIAGNOSIS — Z23 Encounter for immunization: Secondary | ICD-10-CM

## 2022-07-11 DIAGNOSIS — R5383 Other fatigue: Secondary | ICD-10-CM | POA: Diagnosis not present

## 2022-07-11 DIAGNOSIS — R0683 Snoring: Secondary | ICD-10-CM

## 2022-07-11 DIAGNOSIS — E7800 Pure hypercholesterolemia, unspecified: Secondary | ICD-10-CM | POA: Diagnosis not present

## 2022-07-11 LAB — CBC WITH DIFFERENTIAL/PLATELET
Basophils Absolute: 0.1 10*3/uL (ref 0.0–0.1)
Basophils Relative: 0.9 % (ref 0.0–3.0)
Eosinophils Absolute: 0.1 10*3/uL (ref 0.0–0.7)
Eosinophils Relative: 1.2 % (ref 0.0–5.0)
HCT: 42.9 % (ref 36.0–46.0)
Hemoglobin: 14.5 g/dL (ref 12.0–15.0)
Lymphocytes Relative: 23.9 % (ref 12.0–46.0)
Lymphs Abs: 2.2 10*3/uL (ref 0.7–4.0)
MCHC: 33.8 g/dL (ref 30.0–36.0)
MCV: 82 fl (ref 78.0–100.0)
Monocytes Absolute: 0.9 10*3/uL (ref 0.1–1.0)
Monocytes Relative: 9.4 % (ref 3.0–12.0)
Neutro Abs: 6.1 10*3/uL (ref 1.4–7.7)
Neutrophils Relative %: 64.6 % (ref 43.0–77.0)
Platelets: 280 10*3/uL (ref 150.0–400.0)
RBC: 5.24 Mil/uL — ABNORMAL HIGH (ref 3.87–5.11)
RDW: 13.5 % (ref 11.5–15.5)
WBC: 9.4 10*3/uL (ref 4.0–10.5)

## 2022-07-11 LAB — COMPREHENSIVE METABOLIC PANEL
ALT: 22 U/L (ref 0–35)
AST: 22 U/L (ref 0–37)
Albumin: 4.3 g/dL (ref 3.5–5.2)
Alkaline Phosphatase: 83 U/L (ref 39–117)
BUN: 21 mg/dL (ref 6–23)
CO2: 28 mEq/L (ref 19–32)
Calcium: 9.8 mg/dL (ref 8.4–10.5)
Chloride: 103 mEq/L (ref 96–112)
Creatinine, Ser: 1.16 mg/dL (ref 0.40–1.20)
GFR: 50.8 mL/min — ABNORMAL LOW (ref >60.00)
Glucose, Bld: 86 mg/dL (ref 70–99)
Potassium: 3.6 mEq/L (ref 3.5–5.1)
Sodium: 141 mEq/L (ref 135–145)
Total Bilirubin: 0.5 mg/dL (ref 0.2–1.2)
Total Protein: 7.2 g/dL (ref 6.0–8.3)

## 2022-07-11 LAB — CORTISOL: Cortisol, Plasma: 7.5 ug/dL

## 2022-07-11 LAB — LDL CHOLESTEROL, DIRECT: Direct LDL: 114 mg/dL

## 2022-07-11 NOTE — Progress Notes (Signed)
Established Patient Office Visit  Subjective   Patient ID: Emily Stephens, female    DOB: [birth year]  Age: 61 y.o. MRN: [id number]  Chief Complaint  Patient presents with   Follow-up    Follow up for medication. Pt states that she is getting bruises on hands arms if she bumps them x 1 year feeling fatigue     HPI follow-up ongoing fatigue, depression with anxiety, elevated cholesterol.  Has been retired for a few years now.  Feels tired when she gets up in the morning.  She does snore some.  She does wake through the night with vivid dreams.  She has been weaning herself off of the Wellbutrin.  Continues with fluoxetine.  Not really exercising.  She decided not to start the levothyroxine at low-dose.  Up-to-date on health maintenance.    Review of Systems  Constitutional:  Positive for malaise/fatigue. Negative for weight loss.  HENT: Negative.    Eyes:  Negative for blurred vision, discharge and redness.  Respiratory: Negative.    Cardiovascular: Negative.   Gastrointestinal:  Negative for abdominal pain.  Genitourinary: Negative.   Musculoskeletal: Negative.  Negative for myalgias.  Skin:  Negative for rash.  Neurological:  Negative for tingling, loss of consciousness and weakness.  Endo/Heme/Allergies:  Negative for polydipsia.      07/11/2022    1:04 PM 02/01/2022    8:49 AM 12/08/2021   11:16 AM  Depression screen PHQ 2/9  Decreased Interest 0 0 0  Down, Depressed, Hopeless 0 0 0  PHQ - 2 Score 0 0 0  Altered sleeping   0  Tired, decreased energy   0  Change in appetite   0  Feeling bad or failure about yourself    0  Trouble concentrating   0  Moving slowly or fidgety/restless   0  Suicidal thoughts   0  PHQ-9 Score   0  Difficult doing work/chores   Not difficult at all       Objective:     BP 130/78 (BP Location: Right Arm, Patient Position: Sitting, Cuff Size: Large)   Pulse 77   Temp 98.2 F (36.8 C) (Temporal)   Ht '5\' 3"'$  (1.6 m)   Wt 204 lb 9.6 oz  (92.8 kg)   LMP 10/28/2013   SpO2 97%   BMI 36.24 kg/m  Wt Readings from Last 3 Encounters:  07/11/22 204 lb 9.6 oz (92.8 kg)  02/01/22 198 lb 6.4 oz (90 kg)  12/08/21 203 lb 3.2 oz (92.2 kg)      Physical Exam Constitutional:      General: She is not in acute distress.    Appearance: Normal appearance. She is not ill-appearing, toxic-appearing or diaphoretic.  HENT:     Head: Normocephalic and atraumatic.     Right Ear: External ear normal.     Left Ear: External ear normal.     Mouth/Throat:     Mouth: Mucous membranes are moist.     Pharynx: Oropharynx is clear. No oropharyngeal exudate or posterior oropharyngeal erythema.   Eyes:     General: No scleral icterus.       Right eye: No discharge.        Left eye: No discharge.     Extraocular Movements: Extraocular movements intact.     Conjunctiva/sclera: Conjunctivae normal.     Pupils: Pupils are equal, round, and reactive to light.  Cardiovascular:     Rate and Rhythm: Normal rate and regular rhythm.  Pulmonary:  Effort: Pulmonary effort is normal. No respiratory distress.     Breath sounds: Normal breath sounds.  Abdominal:     General: Bowel sounds are normal.  Musculoskeletal:     Cervical back: No rigidity or tenderness.  Skin:    General: Skin is warm and dry.  Neurological:     Mental Status: She is alert and oriented to person, place, and time.  Psychiatric:        Mood and Affect: Mood normal.        Behavior: Behavior normal.      No results found for any visits on 07/11/22.    The 10-year ASCVD risk score (Arnett DK, et al., 2019) is: 4.5%    Assessment & Plan:   Problem List Items Addressed This Visit       Other   Elevated cholesterol   Relevant Orders   Comprehensive metabolic panel   LDL cholesterol, direct   Depression with anxiety - Primary   Other fatigue   Relevant Orders   CBC with Differential/Platelet   Cortisol   Snores   Relevant Orders   Ambulatory referral to  Pulmonology   Other Visit Diagnoses     Need for influenza vaccination       Relevant Orders   Flu Vaccine QUAD 6+ mos PF IM (Fluarix Quad PF)       Return in about 3 months (around 10/11/2022).  Wean off of Wellbutrin XL.  Continue fluoxetine 20 mg daily.  Sleep study for evaluation of apnea.  We will start low-dose levothyroxine for energy.  Encouraged exercise.  Information was given on exercising to lose weight.  Believe that both of these could help her feeling of fatigue.  Libby Maw, MD

## 2022-07-12 NOTE — Telephone Encounter (Signed)
1st last cancel/no show, fee waived, letter sent

## 2022-07-13 ENCOUNTER — Telehealth: Payer: Self-pay | Admitting: Family Medicine

## 2022-07-13 NOTE — Telephone Encounter (Signed)
Caller Name: [name]  Call back phone #: [phone]  Reason for Call: Please call about recent lab results

## 2022-07-13 NOTE — Telephone Encounter (Signed)
Called patient went over labs per patient she's concerned about GFR levels patient wanting to hydrate well and come back next week for repeat. Would this be okay to come back next Monday? Please advise.

## 2022-07-14 ENCOUNTER — Ambulatory Visit: Payer: BC Managed Care – PPO | Admitting: Family Medicine

## 2022-07-14 ENCOUNTER — Other Ambulatory Visit: Payer: Self-pay

## 2022-07-14 DIAGNOSIS — Z0000 Encounter for general adult medical examination without abnormal findings: Secondary | ICD-10-CM

## 2022-07-18 ENCOUNTER — Other Ambulatory Visit (INDEPENDENT_AMBULATORY_CARE_PROVIDER_SITE_OTHER): Payer: BC Managed Care – PPO

## 2022-07-18 DIAGNOSIS — F418 Other specified anxiety disorders: Secondary | ICD-10-CM | POA: Diagnosis not present

## 2022-07-18 DIAGNOSIS — E876 Hypokalemia: Secondary | ICD-10-CM

## 2022-07-18 DIAGNOSIS — E7800 Pure hypercholesterolemia, unspecified: Secondary | ICD-10-CM

## 2022-07-18 DIAGNOSIS — R5383 Other fatigue: Secondary | ICD-10-CM | POA: Diagnosis not present

## 2022-07-18 LAB — COMPREHENSIVE METABOLIC PANEL
ALT: 18 U/L (ref 0–35)
AST: 15 U/L (ref 0–37)
Albumin: 4.2 g/dL (ref 3.5–5.2)
Alkaline Phosphatase: 87 U/L (ref 39–117)
BUN: 18 mg/dL (ref 6–23)
CO2: 24 mEq/L (ref 19–32)
Calcium: 9.4 mg/dL (ref 8.4–10.5)
Chloride: 102 mEq/L (ref 96–112)
Creatinine, Ser: 1.02 mg/dL (ref 0.40–1.20)
GFR: 59.27 mL/min — ABNORMAL LOW (ref >60.00)
Glucose, Bld: 102 mg/dL — ABNORMAL HIGH (ref 70–99)
Potassium: 3.3 mEq/L — ABNORMAL LOW (ref 3.5–5.1)
Sodium: 136 mEq/L (ref 135–145)
Total Bilirubin: 0.5 mg/dL (ref 0.2–1.2)
Total Protein: 7 g/dL (ref 6.0–8.3)

## 2022-07-18 NOTE — Addendum Note (Signed)
Addended by: Lynnea Ferrier on: 07/18/2022 08:53 AM   Modules accepted: Orders

## 2022-07-25 ENCOUNTER — Other Ambulatory Visit (INDEPENDENT_AMBULATORY_CARE_PROVIDER_SITE_OTHER): Payer: BC Managed Care – PPO

## 2022-07-25 DIAGNOSIS — N289 Disorder of kidney and ureter, unspecified: Secondary | ICD-10-CM | POA: Diagnosis not present

## 2022-07-25 DIAGNOSIS — E876 Hypokalemia: Secondary | ICD-10-CM

## 2022-07-25 LAB — COMPREHENSIVE METABOLIC PANEL
ALT: 17 U/L (ref 0–35)
AST: 12 U/L (ref 0–37)
Albumin: 4.4 g/dL (ref 3.5–5.2)
Alkaline Phosphatase: 81 U/L (ref 39–117)
BUN: 20 mg/dL (ref 6–23)
CO2: 21 mEq/L (ref 19–32)
Calcium: 9.4 mg/dL (ref 8.4–10.5)
Chloride: 103 mEq/L (ref 96–112)
Creatinine, Ser: 1.1 mg/dL (ref 0.40–1.20)
GFR: 54.13 mL/min — ABNORMAL LOW (ref >60.00)
Glucose, Bld: 93 mg/dL (ref 70–99)
Potassium: 3.5 mEq/L (ref 3.5–5.1)
Sodium: 138 mEq/L (ref 135–145)
Total Bilirubin: 0.4 mg/dL (ref 0.2–1.2)
Total Protein: 7 g/dL (ref 6.0–8.3)

## 2022-07-25 LAB — POTASSIUM: Potassium: 3.5 mEq/L (ref 3.5–5.1)

## 2022-07-25 NOTE — Addendum Note (Signed)
Addended by: Lynnea Ferrier on: 07/25/2022 01:17 PM   Modules accepted: Orders

## 2022-07-28 ENCOUNTER — Ambulatory Visit: Payer: BC Managed Care – PPO | Admitting: Family Medicine

## 2022-07-28 ENCOUNTER — Other Ambulatory Visit (HOSPITAL_COMMUNITY): Payer: Self-pay

## 2022-07-28 ENCOUNTER — Encounter: Payer: Self-pay | Admitting: Family Medicine

## 2022-07-28 DIAGNOSIS — N289 Disorder of kidney and ureter, unspecified: Secondary | ICD-10-CM | POA: Diagnosis not present

## 2022-07-28 DIAGNOSIS — E66812 Obesity, class 2: Secondary | ICD-10-CM | POA: Insufficient documentation

## 2022-07-28 MED ORDER — TIRZEPATIDE 5 MG/0.5ML ~~LOC~~ SOAJ: 5.0000 mg | SUBCUTANEOUS | 1 refills | Status: DC

## 2022-07-28 NOTE — Progress Notes (Signed)
Established Patient Office Visit  Subjective   Patient ID: Emily Stephens, female    DOB: 04-21-1961  Age: 61 y.o. MRN: [id number]  Chief Complaint  Patient presents with   Follow-up    Discussing labs --kidney function, low potassium. Pt would like to discuss further and also would like to discuss weight loss options     HPI for follow-up discussion of obesity and the need to lose weight.  Expresses concern about decline in GFR over the last months.  Blood pressure is well controlled.  She does not have diabetes or smoke.  She has severe osteoarthritis of both knees.  We will have gel injections next week.  She is incentivize to lose weight.  No family history of chronic kidney disease.    Review of Systems  Constitutional: Negative.   HENT: Negative.    Eyes:  Negative for blurred vision, discharge and redness.  Respiratory: Negative.    Cardiovascular: Negative.   Gastrointestinal:  Negative for abdominal pain.  Genitourinary: Negative.  Negative for dysuria, frequency, hematuria and urgency.  Musculoskeletal:  Positive for joint pain. Negative for myalgias.  Skin:  Negative for rash.  Neurological:  Negative for tingling, loss of consciousness and weakness.  Endo/Heme/Allergies:  Negative for polydipsia.      07/11/2022    1:04 PM 02/01/2022    8:49 AM 12/08/2021   11:16 AM  Depression screen PHQ 2/9  Decreased Interest 0 0 0  Down, Depressed, Hopeless 0 0 0  PHQ - 2 Score 0 0 0  Altered sleeping   0  Tired, decreased energy   0  Change in appetite   0  Feeling bad or failure about yourself    0  Trouble concentrating   0  Moving slowly or fidgety/restless   0  Suicidal thoughts   0  PHQ-9 Score   0  Difficult doing work/chores   Not difficult at all       Objective:     BP 110/67 (BP Location: Left Arm, Patient Position: Sitting, Cuff Size: Normal)   Pulse 96   Temp (!) 97.1 F (36.2 C) (Temporal)   Resp 17   Wt 202 lb 12.8 oz (92 kg)   LMP 10/28/2013    SpO2 97%   BMI 35.92 kg/m    Physical Exam Constitutional:      General: She is not in acute distress.    Appearance: Normal appearance. She is not ill-appearing, toxic-appearing or diaphoretic.  HENT:     Head: Normocephalic and atraumatic.     Right Ear: External ear normal.     Left Ear: External ear normal.  Eyes:     General: No scleral icterus.       Right eye: No discharge.        Left eye: No discharge.     Extraocular Movements: Extraocular movements intact.     Conjunctiva/sclera: Conjunctivae normal.  Pulmonary:     Effort: Pulmonary effort is normal. No respiratory distress.  Skin:    General: Skin is warm and dry.  Neurological:     Mental Status: She is alert and oriented to person, place, and time.  Psychiatric:        Mood and Affect: Mood normal.        Behavior: Behavior normal.      No results found for any visits on 07/28/22.    The 10-year ASCVD risk score (Arnett DK, et al., 2019) is: 3.2%    Assessment &  Plan:   Problem List Items Addressed This Visit       Genitourinary   Renal insufficiency     Other   Morbid obesity ([place]) - Primary   Relevant Medications   tirzepatide (MOUNJARO) 5 MG/0.5ML Pen    Return in about 3 months (around 10/28/2022).  Would like to try [facility] for weight loss.  Discussed theoretical risk of thyroid C cancer.  Discussed risk of intestinal obstruction/pancreatitis.  She would be okay with doing an injection.  She understands that carbohydrate restriction is most important with this medication.  We will follow her kidney function in 3 months.  She understands that adequate hydration, exercise and losing weight would benefit her kidneys.  Libby Maw, MD

## 2022-07-29 ENCOUNTER — Other Ambulatory Visit (HOSPITAL_COMMUNITY): Payer: Self-pay

## 2022-07-29 ENCOUNTER — Telehealth: Payer: Self-pay

## 2022-07-29 MED ORDER — WEGOVY 0.5 MG/0.5ML ~~LOC~~ SOAJ: 0.5000 mg | SUBCUTANEOUS | 3 refills | Status: DC

## 2022-07-29 NOTE — Telephone Encounter (Signed)
Pharmacy Patient Advocate Encounter   Received notification from [facility] that prior authorization for [facility] '5mg'$ /0.6m pen is required/requested.    PA submitted on 07/29/2022 [place]: [id number] Status is pending

## 2022-07-29 NOTE — Telephone Encounter (Signed)
Received a fax regarding Prior Authorization from [facility] for Mounjaro (tirzepatide).   Authorization has been DENIED because your plan covers this drug when the patient has a diagnosis of type 2 diabetes mellitus. Gita KudoJamey Ripa PA Case ID: [id number]  [facility] denial letter scanned to profile.

## 2022-08-01 ENCOUNTER — Encounter: Payer: Self-pay | Admitting: Family Medicine

## 2022-08-23 ENCOUNTER — Ambulatory Visit: Payer: BC Managed Care – PPO | Admitting: Family Medicine

## 2022-08-23 ENCOUNTER — Encounter: Payer: Self-pay | Admitting: Family Medicine

## 2022-08-23 VITALS — BP 144/90 | HR 76 | Temp 97.4°F | Ht 63.0 in | Wt 202.0 lb

## 2022-08-23 DIAGNOSIS — J019 Acute sinusitis, unspecified: Secondary | ICD-10-CM | POA: Insufficient documentation

## 2022-08-23 DIAGNOSIS — H6991 Unspecified Eustachian tube disorder, right ear: Secondary | ICD-10-CM

## 2022-08-23 MED ORDER — AMOXICILLIN 875 MG PO TABS: 875.0000 mg | ORAL_TABLET | Freq: Two times a day (BID) | ORAL | 0 refills | Status: AC

## 2022-08-23 MED ORDER — PREDNISONE 10 MG PO TABS: 10.0000 mg | ORAL_TABLET | Freq: Two times a day (BID) | ORAL | 0 refills | Status: AC

## 2022-08-23 NOTE — Telephone Encounter (Signed)
Patient aware of denial and will check will doctor to see if medication could be changed.

## 2022-08-23 NOTE — Progress Notes (Signed)
Established Patient Office Visit   Subjective:  Patient ID: Emily Stephens, female    DOB: 06-21-1961  Age: 61 y.o. MRN: [id number]  Chief Complaint  Patient presents with   Sinusitis    Cough, congestion and sinus pressure symptoms x 2 weeks.     Sinusitis Associated symptoms include congestion, coughing and headaches. Pertinent negatives include no shortness of breath.   2-week history of URI signs and symptoms that are now concentrated in the head area.  There is facial pressure, purulent rhinorrhea and postnasal drip.  There is ear congestion and discomfort in the ears.  There is a mild cough without wheezing or difficulty breathing.  No fevers or chills.  She has been treating with Mucinex DM. Encounter Diagnoses  Name Primary?   Acute non-recurrent sinusitis, unspecified location Yes   Dysfunction of right eustachian tube       Review of Systems  Constitutional: Negative.   HENT:  Positive for congestion, hearing loss and sinus pain.   Eyes:  Negative for blurred vision, discharge and redness.  Respiratory:  Positive for cough. Negative for sputum production, shortness of breath and wheezing.   Cardiovascular: Negative.   Gastrointestinal:  Negative for abdominal pain.  Genitourinary: Negative.   Musculoskeletal: Negative.  Negative for myalgias.  Skin:  Negative for rash.  Neurological:  Positive for headaches. Negative for tingling, loss of consciousness and weakness.  Endo/Heme/Allergies:  Negative for polydipsia.     Current Outpatient Medications:    amoxicillin (AMOXIL) 875 MG tablet, Take 1 tablet (875 mg total) by mouth 2 (two) times daily for 10 days., Disp: 20 tablet, Rfl: 0   celecoxib (CELEBREX) 200 MG capsule, May take 1 tablet daily as needed, Disp: 90 capsule, Rfl: 1   Cholecalciferol (VITAMIN D3 PO), Take by mouth daily., Disp: , Rfl:    estradiol (ESTRACE) 0.1 MG/GM vaginal cream, daily as needed., Disp: , Rfl:    FLUoxetine (PROZAC) 20 MG tablet,  Take 1 tablet (20 mg total) by mouth daily., Disp: 90 tablet, Rfl: 3   Glucosamine-Chondroitin (OSTEO BI-FLEX REGULAR STRENGTH PO), Take by mouth., Disp: , Rfl:    levothyroxine (SYNTHROID) 25 MCG tablet, Take 1 tablet (25 mcg total) by mouth daily. Each morning on a fasting stomach., Disp: 90 tablet, Rfl: 0   pravastatin (PRAVACHOL) 40 MG tablet, Take 1 tablet (40 mg total) by mouth daily., Disp: 90 tablet, Rfl: 3   predniSONE (DELTASONE) 10 MG tablet, Take 1 tablet (10 mg total) by mouth 2 (two) times daily with a meal for 5 days., Disp: 10 tablet, Rfl: 0   triamterene-hydrochlorothiazide (MAXZIDE) 75-50 MG tablet, TAKE 1 TABLET BY MOUTH DAILY, Disp: 90 tablet, Rfl: 1   Semaglutide-Weight Management (WEGOVY) 0.5 MG/0.5ML SOAJ, Inject 0.5 mg into the skin once a week. (Patient not taking: Reported on 08/23/2022), Disp: 2 mL, Rfl: 3   Objective:     BP (!) 144/90 (BP Location: Right Arm, Patient Position: Sitting, Cuff Size: Normal)   Pulse 76   Temp (!) 97.4 F (36.3 C) (Temporal)   Ht '5\' 3"'$  (1.6 m)   Wt 202 lb (91.6 kg)   LMP 10/28/2013   SpO2 98%   BMI 35.78 kg/m  BP Readings from Last 3 Encounters:  08/23/22 (!) 144/90  07/28/22 110/67  07/11/22 130/78   Wt Readings from Last 3 Encounters:  08/23/22 202 lb (91.6 kg)  07/28/22 202 lb 12.8 oz (92 kg)  07/11/22 204 lb 9.6 oz (92.8 kg)  Physical Exam Constitutional:      General: She is not in acute distress.    Appearance: Normal appearance. She is not ill-appearing, toxic-appearing or diaphoretic.  HENT:     Head: Normocephalic and atraumatic.     Right Ear: Ear canal and external ear normal. No middle ear effusion. Tympanic membrane is retracted. Tympanic membrane is not erythematous.     Left Ear: Ear canal and external ear normal.  No middle ear effusion. Tympanic membrane is not erythematous.     Mouth/Throat:     Mouth: Mucous membranes are moist.     Pharynx: Oropharynx is clear. No oropharyngeal exudate or  posterior oropharyngeal erythema.  Eyes:     General: No scleral icterus.       Right eye: No discharge.        Left eye: No discharge.     Extraocular Movements: Extraocular movements intact.     Conjunctiva/sclera: Conjunctivae normal.     Pupils: Pupils are equal, round, and reactive to light.  Cardiovascular:     Rate and Rhythm: Normal rate and regular rhythm.  Pulmonary:     Effort: Pulmonary effort is normal. No respiratory distress.     Breath sounds: Normal breath sounds. No wheezing or rales.  Abdominal:     General: Bowel sounds are normal.     Tenderness: There is no abdominal tenderness. There is no guarding.  Musculoskeletal:     Cervical back: No rigidity or tenderness.  Skin:    General: Skin is warm and dry.  Neurological:     Mental Status: She is alert and oriented to person, place, and time.  Psychiatric:        Mood and Affect: Mood normal.        Behavior: Behavior normal.      No results found for any visits on 08/23/22.    The 10-year ASCVD risk score (Arnett DK, et al., 2019) is: 5.5%    Assessment & Plan:   Acute non-recurrent sinusitis, unspecified location -     Amoxicillin; Take 1 tablet (875 mg total) by mouth 2 (two) times daily for 10 days.  Dispense: 20 tablet; Refill: 0  Dysfunction of right eustachian tube -     predniSONE; Take 1 tablet (10 mg total) by mouth 2 (two) times daily with a meal for 5 days.  Dispense: 10 tablet; Refill: 0    Return if symptoms worsen or fail to improve.  Treat sinusitis with Amoxil 875 twice daily for 10 days.  Prednisone 10 twice daily for 5 days for ETD.  Demonstrated eustachian tube dysfunction exercises.  Libby Maw, MD

## 2022-09-06 ENCOUNTER — Other Ambulatory Visit: Payer: Self-pay | Admitting: Obstetrics and Gynecology

## 2022-09-06 DIAGNOSIS — R928 Other abnormal and inconclusive findings on diagnostic imaging of breast: Secondary | ICD-10-CM

## 2022-09-09 ENCOUNTER — Ambulatory Visit: Payer: BC Managed Care – PPO | Admitting: Family Medicine

## 2022-09-22 ENCOUNTER — Ambulatory Visit: Payer: BC Managed Care – PPO | Admitting: Family Medicine

## 2022-09-23 ENCOUNTER — Other Ambulatory Visit: Payer: Self-pay | Admitting: Family Medicine

## 2022-09-23 DIAGNOSIS — E7800 Pure hypercholesterolemia, unspecified: Secondary | ICD-10-CM

## 2022-09-23 DIAGNOSIS — F418 Other specified anxiety disorders: Secondary | ICD-10-CM

## 2022-09-27 ENCOUNTER — Ambulatory Visit
Admission: RE | Admit: 2022-09-27 | Discharge: 2022-09-27 | Disposition: A | Discharge status: Home / Self Care | Payer: BC Managed Care – PPO | Source: Ambulatory Visit | Attending: Obstetrics and Gynecology | Admitting: Obstetrics and Gynecology

## 2022-09-27 DIAGNOSIS — R928 Other abnormal and inconclusive findings on diagnostic imaging of breast: Secondary | ICD-10-CM

## 2022-10-06 ENCOUNTER — Other Ambulatory Visit: Payer: Self-pay | Admitting: Family Medicine

## 2022-10-06 DIAGNOSIS — I10 Essential (primary) hypertension: Secondary | ICD-10-CM

## 2022-10-06 MED ORDER — TRIAMTERENE-HCTZ 75-50 MG PO TABS: 1.0000 | ORAL_TABLET | Freq: Every day | ORAL | 1 refills | Status: DC

## 2022-10-06 NOTE — Addendum Note (Signed)
Addended by: Jon Billings on: 10/06/2022 05:12 PM   Modules accepted: Orders

## 2022-10-21 ENCOUNTER — Other Ambulatory Visit: Payer: BC Managed Care – PPO

## 2022-10-27 ENCOUNTER — Encounter: Payer: Self-pay | Admitting: Family Medicine

## 2022-11-04 NOTE — Telephone Encounter (Signed)
Pt is calling again about her surgery form. She stated she  can not get her surgery until this is faxed.

## 2022-12-01 ENCOUNTER — Ambulatory Visit: Payer: BC Managed Care – PPO | Admitting: Family Medicine

## 2022-12-09 ENCOUNTER — Encounter: Payer: Self-pay | Admitting: Family Medicine

## 2022-12-09 ENCOUNTER — Ambulatory Visit: Payer: BC Managed Care – PPO | Admitting: Family Medicine

## 2022-12-09 VITALS — BP 132/70 | HR 91 | Temp 98.5°F | Ht 63.0 in | Wt 200.0 lb

## 2022-12-09 DIAGNOSIS — R5383 Other fatigue: Secondary | ICD-10-CM | POA: Diagnosis not present

## 2022-12-09 DIAGNOSIS — N289 Disorder of kidney and ureter, unspecified: Secondary | ICD-10-CM | POA: Diagnosis not present

## 2022-12-09 DIAGNOSIS — B349 Viral infection, unspecified: Secondary | ICD-10-CM | POA: Diagnosis not present

## 2022-12-09 MED ORDER — LEVOTHYROXINE SODIUM 25 MCG PO TABS: 25.0000 ug | ORAL_TABLET | Freq: Every day | ORAL | 0 refills | Status: DC

## 2022-12-09 MED ORDER — HYDROCODONE BIT-HOMATROP MBR 5-1.5 MG/5ML PO SOLN: 5.0000 mL | Freq: Three times a day (TID) | ORAL | 0 refills | Status: DC | PRN

## 2022-12-09 NOTE — Progress Notes (Signed)
Established Patient Office Visit   Subjective:  Patient ID: Emily Stephens, female    DOB: 1961-07-23  Age: 62 y.o. MRN: [id number]  Chief Complaint  Patient presents with   Cough    Cough, headaches, body aches symptoms x 1 week.     Cough Associated symptoms include a sore throat. Pertinent negatives include no eye redness, myalgias, rash, shortness of breath or wheezing.   Encounter Diagnoses  Name Primary?   Viral syndrome Yes   Other fatigue    Renal insufficiency    Presents with a 5 to 6-day history of URI with frontal headache, congestion, postnasal drip, sore throat, cough.  Denies fevers chills body aches difficulty breathing or wheezing.  Believes the low-dose levothyroxine may be helping some with fatigue.  Total knee is tentatively scheduled for May.   Review of Systems  Constitutional: Negative.   HENT:  Positive for congestion and sore throat.   Eyes:  Negative for blurred vision, discharge and redness.  Respiratory:  Positive for cough. Negative for sputum production, shortness of breath and wheezing.   Cardiovascular: Negative.   Gastrointestinal:  Negative for abdominal pain.  Genitourinary: Negative.   Musculoskeletal:  Positive for joint pain. Negative for myalgias.  Skin:  Negative for rash.  Neurological:  Negative for tingling, loss of consciousness and weakness.  Endo/Heme/Allergies:  Negative for polydipsia.     Current Outpatient Medications:    Cholecalciferol (VITAMIN D3 PO), Take by mouth daily., Disp: , Rfl:    estradiol (ESTRACE) 0.1 MG/GM vaginal cream, daily as needed., Disp: , Rfl:    FLUoxetine (PROZAC) 20 MG tablet, Take 1 tablet (20 mg total) by mouth daily., Disp: 90 tablet, Rfl: 3   HYDROcodone bit-homatropine (HYCODAN) 5-1.5 MG/5ML syrup, Take 5 mLs by mouth every 8 (eight) hours as needed for cough., Disp: 120 mL, Rfl: 0   pravastatin (PRAVACHOL) 40 MG tablet, TAKE 1 TABLET(40 MG) BY MOUTH DAILY, Disp: 90 tablet, Rfl: 3    triamterene-hydrochlorothiazide (MAXZIDE) 75-50 MG tablet, Take 1 tablet by mouth daily., Disp: 90 tablet, Rfl: 1   Glucosamine-Chondroitin (OSTEO BI-FLEX REGULAR STRENGTH PO), Take by mouth. (Patient not taking: Reported on 12/09/2022), Disp: , Rfl:    levothyroxine (SYNTHROID) 25 MCG tablet, Take 1 tablet (25 mcg total) by mouth daily. Each morning on a fasting stomach., Disp: 90 tablet, Rfl: 0   Semaglutide-Weight Management (WEGOVY) 0.5 MG/0.5ML SOAJ, Inject 0.5 mg into the skin once a week. (Patient not taking: Reported on 08/23/2022), Disp: 2 mL, Rfl: 3   Objective:     BP 132/70 (BP Location: Right Arm, Patient Position: Sitting, Cuff Size: Large)   Pulse 91   Temp 98.5 F (36.9 C) (Temporal)   Ht 5\' 3"  (1.6 m)   Wt 200 lb (90.7 kg)   LMP 10/28/2013   SpO2 97%   BMI 35.43 kg/m  Wt Readings from Last 3 Encounters:  12/09/22 200 lb (90.7 kg)  08/23/22 202 lb (91.6 kg)  07/28/22 202 lb 12.8 oz (92 kg)      Physical Exam Constitutional:      General: She is not in acute distress.    Appearance: Normal appearance. She is not ill-appearing, toxic-appearing or diaphoretic.  HENT:     Head: Normocephalic and atraumatic.     Right Ear: External ear normal.     Left Ear: External ear normal.     Mouth/Throat:     Mouth: Mucous membranes are moist.     Pharynx: Oropharynx is clear.  No oropharyngeal exudate or posterior oropharyngeal erythema.  Eyes:     General: No scleral icterus.       Right eye: No discharge.        Left eye: No discharge.     Extraocular Movements: Extraocular movements intact.     Conjunctiva/sclera: Conjunctivae normal.     Pupils: Pupils are equal, round, and reactive to light.  Cardiovascular:     Rate and Rhythm: Normal rate and regular rhythm.  Pulmonary:     Effort: Pulmonary effort is normal. No respiratory distress.     Breath sounds: Normal breath sounds. No wheezing or rhonchi.  Abdominal:     General: Bowel sounds are normal.   Musculoskeletal:     Cervical back: No rigidity or tenderness.  Lymphadenopathy:     Cervical: No cervical adenopathy.  Skin:    General: Skin is warm and dry.  Neurological:     Mental Status: She is alert and oriented to person, place, and time.  Psychiatric:        Mood and Affect: Mood normal.        Behavior: Behavior normal.      No results found for any visits on 12/09/22.    The 10-year ASCVD risk score (Arnett DK, et al., 2019) is: 5.2%    Assessment & Plan:   Viral syndrome -     HYDROcodone Bit-Homatrop MBr; Take 5 mLs by mouth every 8 (eight) hours as needed for cough.  Dispense: 120 mL; Refill: 0  Other fatigue -     Levothyroxine Sodium; Take 1 tablet (25 mcg total) by mouth daily. Each morning on a fasting stomach.  Dispense: 90 tablet; Refill: 0 -     TSH -     Hemoglobin A1c  Renal insufficiency -     Basic metabolic panel -     Urinalysis, Routine w reflex microscopic -     Microalbumin / creatinine urine ratio    Return in about 6 months (around 06/11/2023), or Call Wednesday of improving.Libby Maw, MD

## 2022-12-11 LAB — URINALYSIS, ROUTINE W REFLEX MICROSCOPIC
Bilirubin, UA: NEGATIVE
Glucose, UA: NEGATIVE
Ketones, UA: NEGATIVE
Nitrite, UA: NEGATIVE
Protein,UA: NEGATIVE
RBC, UA: NEGATIVE
Specific Gravity, UA: 1.009 (ref 1.005–1.030)
Urobilinogen, Ur: 0.2 mg/dL (ref 0.2–1.0)
pH, UA: 7.5 (ref 5.0–7.5)

## 2022-12-11 LAB — BASIC METABOLIC PANEL
BUN/Creatinine Ratio: 13 (ref 12–28)
BUN: 15 mg/dL (ref 8–27)
CO2: 23 mmol/L (ref 20–29)
Calcium: 9.8 mg/dL (ref 8.7–10.3)
Chloride: 98 mmol/L (ref 96–106)
Creatinine, Ser: 1.15 mg/dL — ABNORMAL HIGH (ref 0.57–1.00)
Glucose: 97 mg/dL (ref 70–99)
Potassium: 3.4 mmol/L — ABNORMAL LOW (ref 3.5–5.2)
Sodium: 140 mmol/L (ref 134–144)
eGFR: 54 mL/min/{1.73_m2} — ABNORMAL LOW (ref >59)

## 2022-12-11 LAB — MICROALBUMIN / CREATININE URINE RATIO
Creatinine, Urine: 59 mg/dL
Microalb/Creat Ratio: 14 mg/g creat (ref 0–29)
Microalbumin, Urine: 8.5 ug/mL

## 2022-12-11 LAB — MICROSCOPIC EXAMINATION
Bacteria, UA: NONE SEEN
Casts: NONE SEEN /lpf
RBC, Urine: NONE SEEN /hpf (ref 0–2)

## 2022-12-11 LAB — HEMOGLOBIN A1C
Est. average glucose Bld gHb Est-mCnc: 114 mg/dL
Hgb A1c MFr Bld: 5.6 % (ref 4.8–5.6)

## 2022-12-11 LAB — TSH: TSH: 0.657 u[IU]/mL (ref 0.450–4.500)

## 2023-01-17 ENCOUNTER — Encounter: Payer: Self-pay | Admitting: Family Medicine

## 2023-01-17 ENCOUNTER — Telehealth (INDEPENDENT_AMBULATORY_CARE_PROVIDER_SITE_OTHER): Payer: BC Managed Care – PPO | Admitting: Family Medicine

## 2023-01-17 VITALS — Ht 63.0 in

## 2023-01-17 DIAGNOSIS — L680 Hirsutism: Secondary | ICD-10-CM

## 2023-01-17 DIAGNOSIS — R58 Hemorrhage, not elsewhere classified: Secondary | ICD-10-CM | POA: Diagnosis not present

## 2023-01-17 NOTE — Progress Notes (Signed)
Established Patient Office Visit   Subjective:  Patient ID: Emily Stephens, female    DOB: [birth year]  Age: 62 y.o. MRN: [id number]  Chief Complaint  Patient presents with   Obesity    Concerns about continued weight gain, fatigue and facial hair going on for years becoming worse     HPI Encounter Diagnoses  Name Primary?   Ecchymosis of forearm Yes   Hirsutism    Has upcoming knee replacement surgery towards the end of May and is concerned about bruises that have been occurring on her arms.  She has always had this tendency.  She will have lab work next week for her preop.  Recent CBC and CMP's have been normal for her.  Also concerned about expanding hirsutism affecting her face.  She is postmenopausal.  Recent cortisol levels have been normal for her.  She is concerned that the bruises may affect her surgery.   Review of Systems  Constitutional: Negative.   HENT: Negative.    Eyes:  Negative for blurred vision, discharge and redness.  Respiratory: Negative.  Negative for hemoptysis.   Cardiovascular: Negative.   Gastrointestinal:  Negative for abdominal pain, blood in stool and melena.  Genitourinary: Negative.  Negative for hematuria.  Musculoskeletal:  Positive for joint pain. Negative for myalgias.  Skin:  Negative for rash.  Neurological:  Negative for tingling, loss of consciousness and weakness.  Endo/Heme/Allergies:  Negative for polydipsia.     Current Outpatient Medications:    Cholecalciferol (VITAMIN D3 PO), Take by mouth daily., Disp: , Rfl:    estradiol (ESTRACE) 0.1 MG/GM vaginal cream, daily as needed., Disp: , Rfl:    levothyroxine (SYNTHROID) 25 MCG tablet, Take 1 tablet (25 mcg total) by mouth daily. Each morning on a fasting stomach., Disp: 90 tablet, Rfl: 0   pravastatin (PRAVACHOL) 40 MG tablet, TAKE 1 TABLET(40 MG) BY MOUTH DAILY, Disp: 90 tablet, Rfl: 3   triamterene-hydrochlorothiazide (MAXZIDE) 75-50 MG tablet, Take 1 tablet by mouth daily.,  Disp: 90 tablet, Rfl: 1   FLUoxetine (PROZAC) 20 MG tablet, Take 1 tablet (20 mg total) by mouth daily. (Patient not taking: Reported on 01/17/2023), Disp: 90 tablet, Rfl: 3   Glucosamine-Chondroitin (OSTEO BI-FLEX REGULAR STRENGTH PO), Take by mouth. (Patient not taking: Reported on 12/09/2022), Disp: , Rfl:    Semaglutide-Weight Management (WEGOVY) 0.5 MG/0.5ML SOAJ, Inject 0.5 mg into the skin once a week. (Patient not taking: Reported on 08/23/2022), Disp: 2 mL, Rfl: 3   Objective:     Ht 5\' 3"  (1.6 m)   LMP 10/28/2013   BMI 35.43 kg/m    Physical Exam Constitutional:      General: She is not in acute distress.    Appearance: Normal appearance. She is not ill-appearing, toxic-appearing or diaphoretic.  HENT:     Head: Normocephalic and atraumatic.     Right Ear: External ear normal.     Left Ear: External ear normal.  Eyes:     General: No scleral icterus.       Right eye: No discharge.        Left eye: No discharge.     Extraocular Movements: Extraocular movements intact.     Conjunctiva/sclera: Conjunctivae normal.  Pulmonary:     Effort: Pulmonary effort is normal. No respiratory distress.  Skin:    General: Skin is warm and dry.  Neurological:     Mental Status: She is alert and oriented to person, place, and time.  Psychiatric:  Mood and Affect: Mood normal.        Behavior: Behavior normal.      No results found for any visits on 01/17/23.    The 10-year ASCVD risk score (Arnett DK, et al., 2019) is: 5.2%    Assessment & Plan:   Ecchymosis of forearm  Hirsutism    Return Will follow-up here at the clinic with me a month or so after her surgery..  Recommended reassurance especially with upcoming lab work.  Will see me after her surgery when it is easier for her to move about.  Mliss Sax, MD  Virtual Visit via Video Note  I connected with Emily Stephens on 01/17/23 at 11:00 AM EDT by a video enabled telemedicine application and  verified that I am speaking with the correct person using two identifiers.  Location: Patient: Home alone in a room.  Provider: work   I discussed the limitations of evaluation and management by telemedicine and the availability of in person appointments. The patient expressed understanding and agreed to proceed.  History of Present Illness:    Observations/Objective:   Assessment and Plan:   Follow Up Instructions:    I discussed the assessment and treatment plan with the patient. The patient was provided an opportunity to ask questions and all were answered. The patient agreed with the plan and demonstrated an understanding of the instructions.   The patient was advised to call back or seek an in-person evaluation if the symptoms worsen or if the condition fails to improve as anticipated.  I provided 20 minutes of non-face-to-face time during this encounter.   Mliss Sax, MD

## 2023-02-28 ENCOUNTER — Ambulatory Visit (HOSPITAL_COMMUNITY)
Admission: RE | Admit: 2023-02-28 | Discharge: 2023-02-28 | Disposition: A | Discharge status: Home / Self Care | Payer: BC Managed Care – PPO | Source: Ambulatory Visit | Attending: Internal Medicine | Admitting: Internal Medicine

## 2023-02-28 ENCOUNTER — Other Ambulatory Visit (HOSPITAL_COMMUNITY): Payer: Self-pay | Admitting: Emergency Medicine

## 2023-02-28 ENCOUNTER — Other Ambulatory Visit (HOSPITAL_COMMUNITY): Payer: Self-pay | Admitting: Orthopedic Surgery

## 2023-02-28 DIAGNOSIS — M79662 Pain in left lower leg: Secondary | ICD-10-CM | POA: Insufficient documentation

## 2023-02-28 DIAGNOSIS — M7989 Other specified soft tissue disorders: Secondary | ICD-10-CM | POA: Insufficient documentation

## 2023-03-05 ENCOUNTER — Other Ambulatory Visit: Payer: Self-pay | Admitting: Family Medicine

## 2023-03-05 DIAGNOSIS — R5383 Other fatigue: Secondary | ICD-10-CM

## 2023-03-05 DIAGNOSIS — I10 Essential (primary) hypertension: Secondary | ICD-10-CM

## 2023-03-29 ENCOUNTER — Telehealth: Payer: Self-pay | Admitting: Family Medicine

## 2023-03-29 NOTE — Telephone Encounter (Signed)
Prescription Request  03/29/2023  LOV: 12/09/2022  What is the name of the medication or equipment? buPROPion (WELLBUTRIN XL) 300 MG 24 hr tablet [[id number]]   Have you contacted your pharmacy to request a refill? No   Which pharmacy would you like this sent to?  [id number] [place], [place] - [address] AT [facility] [address] [place] [address] Phone: [phone] Fax: [phone]    Patient notified that their request is being sent to the clinical staff for review and that they should receive a response within 2 business days.   Please advise at Mobile [phone] (mobile)

## 2023-04-25 ENCOUNTER — Encounter: Payer: Self-pay | Admitting: Family Medicine

## 2023-04-25 ENCOUNTER — Ambulatory Visit: Payer: BC Managed Care – PPO | Admitting: Family Medicine

## 2023-04-25 VITALS — BP 118/82 | HR 85 | Temp 97.3°F | Ht 63.0 in | Wt 201.4 lb

## 2023-04-25 DIAGNOSIS — M1711 Unilateral primary osteoarthritis, right knee: Secondary | ICD-10-CM | POA: Diagnosis not present

## 2023-04-25 DIAGNOSIS — E7800 Pure hypercholesterolemia, unspecified: Secondary | ICD-10-CM

## 2023-04-25 MED ORDER — TRAMADOL HCL 50 MG PO TABS
50.0000 mg | ORAL_TABLET | Freq: Two times a day (BID) | ORAL | 1 refills | Status: AC | PRN
Start: 2023-04-25 — End: 2023-04-30

## 2023-04-25 NOTE — Progress Notes (Signed)
Established Patient Office Visit   Subjective:  Patient ID: Emily Stephens, female    DOB: Aug 15, 1961  Age: 62 y.o. MRN: [id number]  Chief Complaint  Patient presents with   Medical Management of Chronic Issues    Follow up right knee surgery. Pt states she will have left knee surgery in October.     HPI Encounter Diagnoses  Name Primary?   Arthritis of knee, right Yes   Elevated cholesterol    Morbid obesity (HCC)    Follow-up of the above.  Continues to recover well from recent left knee replacement.  She had to have the right knee replaced at the end of October.  Walking has been difficult for her secondary to pain in the right knee.  Tylenol helps some.  Neurontin has been helpful for nerve pain at night.  She continues probably call for elevated cholesterol without issue.   Review of Systems  Constitutional: Negative.   HENT: Negative.    Eyes:  Negative for blurred vision, discharge and redness.  Respiratory: Negative.    Cardiovascular: Negative.   Gastrointestinal:  Negative for abdominal pain.  Genitourinary: Negative.   Musculoskeletal:  Positive for joint pain. Negative for myalgias.  Skin:  Negative for rash.  Neurological:  Negative for tingling, loss of consciousness and weakness.  Endo/Heme/Allergies:  Negative for polydipsia.     Current Outpatient Medications:    Cholecalciferol (VITAMIN D3 PO), Take by mouth daily., Disp: , Rfl:    estradiol (ESTRACE) 0.1 MG/GM vaginal cream, daily as needed., Disp: , Rfl:    FLUoxetine (PROZAC) 20 MG tablet, Take 1 tablet (20 mg total) by mouth daily., Disp: 90 tablet, Rfl: 3   Homeopathic Products (ARNICA EX), Apply topically., Disp: , Rfl:    levothyroxine (SYNTHROID) 25 MCG tablet, TAKE 1 TABLET(25 MCG) BY MOUTH EVERY MORNING FASTING FOR STOMACH, Disp: 90 tablet, Rfl: 0   Misc Natural Products (JOINT SUPPORT PO), Take by mouth., Disp: , Rfl:    Multiple Vitamin (MULTIVITAMIN) capsule, Take 1 capsule by mouth daily.,  Disp: , Rfl:    pravastatin (PRAVACHOL) 40 MG tablet, TAKE 1 TABLET(40 MG) BY MOUTH DAILY, Disp: 90 tablet, Rfl: 3   traMADol (ULTRAM) 50 MG tablet, Take 1 tablet (50 mg total) by mouth every 12 (twelve) hours as needed for up to 5 days., Disp: 30 tablet, Rfl: 1   triamterene-hydrochlorothiazide (MAXZIDE) 75-50 MG tablet, TAKE 1 TABLET BY MOUTH DAILY, Disp: 90 tablet, Rfl: 1   Glucosamine-Chondroitin (OSTEO BI-FLEX REGULAR STRENGTH PO), Take by mouth. (Patient not taking: Reported on 12/09/2022), Disp: , Rfl:    Semaglutide-Weight Management (WEGOVY) 0.5 MG/0.5ML SOAJ, Inject 0.5 mg into the skin once a week. (Patient not taking: Reported on 08/23/2022), Disp: 2 mL, Rfl: 3   Objective:     BP 118/82   Pulse 85   Temp (!) 97.3 F (36.3 C)   Ht 5\' 3"  (1.6 m)   Wt 201 lb 6.4 oz (91.4 kg)   LMP 10/28/2013   SpO2 97%   BMI 35.68 kg/m    Physical Exam Constitutional:      General: She is not in acute distress.    Appearance: Normal appearance. She is not ill-appearing, toxic-appearing or diaphoretic.  HENT:     Head: Normocephalic and atraumatic.     Right Ear: External ear normal.     Left Ear: External ear normal.     Mouth/Throat:     Mouth: Mucous membranes are moist.     Pharynx:  Oropharynx is clear. No oropharyngeal exudate or posterior oropharyngeal erythema.  Eyes:     General: No scleral icterus.       Right eye: No discharge.        Left eye: No discharge.     Extraocular Movements: Extraocular movements intact.     Conjunctiva/sclera: Conjunctivae normal.     Pupils: Pupils are equal, round, and reactive to light.  Cardiovascular:     Rate and Rhythm: Normal rate and regular rhythm.  Pulmonary:     Effort: Pulmonary effort is normal. No respiratory distress.     Breath sounds: Normal breath sounds.  Musculoskeletal:     Cervical back: No rigidity or tenderness.  Lymphadenopathy:     Cervical: No cervical adenopathy.  Skin:    General: Skin is warm and dry.   Neurological:     Mental Status: She is alert and oriented to person, place, and time.  Psychiatric:        Mood and Affect: Mood normal.        Behavior: Behavior normal.      No results found for any visits on 04/25/23.    The 10-year ASCVD risk score (Arnett DK, et al., 2019) is: 4.1%    Assessment & Plan:   Arthritis of knee, right -     traMADol HCl; Take 1 tablet (50 mg total) by mouth every 12 (twelve) hours as needed for up to 5 days.  Dispense: 30 tablet; Refill: 1  Elevated cholesterol -     Basic metabolic panel; Future -     Lipid panel; Future  Morbid obesity (HCC)    Return in about 6 months (around 10/26/2023), or if symptoms worsen or fail to improve.  Will return fasting for above ordered blood work.  Will use tramadol as needed twice daily.  Refill given.  Will continue weight loss efforts.  Mliss Sax, MD

## 2023-04-28 ENCOUNTER — Other Ambulatory Visit (INDEPENDENT_AMBULATORY_CARE_PROVIDER_SITE_OTHER): Payer: BC Managed Care – PPO

## 2023-04-28 DIAGNOSIS — E7800 Pure hypercholesterolemia, unspecified: Secondary | ICD-10-CM

## 2023-04-28 LAB — BASIC METABOLIC PANEL
BUN: 19 mg/dL (ref 6–23)
CO2: 25 mEq/L (ref 19–32)
Calcium: 10.1 mg/dL (ref 8.4–10.5)
Chloride: 98 mEq/L (ref 96–112)
Creatinine, Ser: 1.03 mg/dL (ref 0.40–1.20)
GFR: 58.26 mL/min — ABNORMAL LOW (ref >60.00)
Glucose, Bld: 103 mg/dL — ABNORMAL HIGH (ref 70–99)
Potassium: 4.1 mEq/L (ref 3.5–5.1)
Sodium: 137 mEq/L (ref 135–145)

## 2023-04-28 LAB — LIPID PANEL
Cholesterol: 157 mg/dL (ref 0–200)
HDL: 45.7 mg/dL (ref >39.00)
LDL Cholesterol: 78 mg/dL (ref 0–99)
NonHDL: 110.95
Total CHOL/HDL Ratio: 3
Triglycerides: 165 mg/dL — ABNORMAL HIGH (ref 0.0–149.0)
VLDL: 33 mg/dL (ref 0.0–40.0)

## 2023-05-09 ENCOUNTER — Encounter: Payer: Self-pay | Admitting: Family Medicine

## 2023-05-09 ENCOUNTER — Ambulatory Visit: Payer: BC Managed Care – PPO | Admitting: Family Medicine

## 2023-05-09 VITALS — BP 118/82 | HR 68 | Temp 97.8°F | Ht 63.0 in | Wt 196.8 lb

## 2023-05-09 DIAGNOSIS — R0609 Other forms of dyspnea: Secondary | ICD-10-CM | POA: Insufficient documentation

## 2023-05-09 DIAGNOSIS — R5383 Other fatigue: Secondary | ICD-10-CM | POA: Diagnosis not present

## 2023-05-09 DIAGNOSIS — R0681 Apnea, not elsewhere classified: Secondary | ICD-10-CM | POA: Insufficient documentation

## 2023-05-09 DIAGNOSIS — Z8249 Family history of ischemic heart disease and other diseases of the circulatory system: Secondary | ICD-10-CM | POA: Diagnosis not present

## 2023-05-09 NOTE — Progress Notes (Signed)
Established Patient Office Visit   Subjective:  Patient ID: Emily Stephens, female    DOB: 1960/12/26  Age: 62 y.o. MRN: [id number]  Chief Complaint  Patient presents with   Fatigue    Pt states she has had fatigue since she had her knee surgery x 2 months.     HPI Encounter Diagnoses  Name Primary?   DOE (dyspnea on exertion) Yes   Other fatigue    Apnea    Family history of cardiomyopathy    Longstanding history of fatigue but more recently accompanied by shortness of breath with some diaphoresis associated with relatively minimal exertion such as getting ready for the day or housecleaning.  There is been no chest pain or nausea.  Elevated cholesterol well-controlled with proper call.  A1c 3 months ago 5.6.  Has been treated with low-dose levothyroxine for fatigue which has not helped.  Recent left knee replacement.  Pain with right knee that needs replacement.  Patient has not been able to exercise   Review of Systems  Constitutional:  Positive for diaphoresis.  HENT: Negative.    Eyes:  Negative for blurred vision, discharge and redness.  Respiratory:  Positive for shortness of breath.   Cardiovascular: Negative.  Negative for chest pain.  Gastrointestinal:  Negative for abdominal pain, nausea and vomiting.  Genitourinary: Negative.   Musculoskeletal: Negative.  Negative for myalgias.  Skin:  Negative for rash.  Neurological:  Negative for tingling, loss of consciousness and weakness.  Endo/Heme/Allergies:  Negative for polydipsia.     Current Outpatient Medications:    Cholecalciferol (VITAMIN D3 PO), Take by mouth daily., Disp: , Rfl:    estradiol (ESTRACE) 0.1 MG/GM vaginal cream, daily as needed., Disp: , Rfl:    FLUoxetine (PROZAC) 20 MG tablet, Take 1 tablet (20 mg total) by mouth daily., Disp: 90 tablet, Rfl: 3   Homeopathic Products (ARNICA EX), Apply topically., Disp: , Rfl:    Misc Natural Products (JOINT SUPPORT PO), Take by mouth., Disp: , Rfl:     Multiple Vitamin (MULTIVITAMIN) capsule, Take 1 capsule by mouth daily., Disp: , Rfl:    pravastatin (PRAVACHOL) 40 MG tablet, TAKE 1 TABLET(40 MG) BY MOUTH DAILY, Disp: 90 tablet, Rfl: 3   triamterene-hydrochlorothiazide (MAXZIDE) 75-50 MG tablet, TAKE 1 TABLET BY MOUTH DAILY, Disp: 90 tablet, Rfl: 1   Objective:     BP 118/82   Pulse 68   Temp 97.8 F (36.6 C)   Ht 5\' 3"  (1.6 m)   Wt 196 lb 12.8 oz (89.3 kg)   LMP 10/28/2013   BMI 34.86 kg/m    Physical Exam Constitutional:      General: She is not in acute distress.    Appearance: Normal appearance. She is not ill-appearing, toxic-appearing or diaphoretic.  HENT:     Head: Normocephalic and atraumatic.     Right Ear: External ear normal.     Left Ear: External ear normal.     Mouth/Throat:     Mouth: Mucous membranes are moist.     Pharynx: Oropharynx is clear. No oropharyngeal exudate or posterior oropharyngeal erythema.   Eyes:     General: No scleral icterus.       Right eye: No discharge.        Left eye: No discharge.     Extraocular Movements: Extraocular movements intact.     Conjunctiva/sclera: Conjunctivae normal.     Pupils: Pupils are equal, round, and reactive to light.  Cardiovascular:     Rate  and Rhythm: Normal rate and regular rhythm.  Pulmonary:     Effort: Pulmonary effort is normal. No respiratory distress.     Breath sounds: Normal breath sounds. No wheezing, rhonchi or rales.  Abdominal:     General: Bowel sounds are normal.     Tenderness: There is no abdominal tenderness. There is no guarding.  Musculoskeletal:     Cervical back: No rigidity or tenderness.  Skin:    General: Skin is warm and dry.  Neurological:     Mental Status: She is alert and oriented to person, place, and time.  Psychiatric:        Mood and Affect: Mood normal.        Behavior: Behavior normal.      No results found for any visits on 05/09/23.    The 10-year ASCVD risk score (Arnett DK, et al., 2019) is:  4.4%    Assessment & Plan:   DOE (dyspnea on exertion) -     Ambulatory referral to Cardiology -     Ambulatory referral to Pulmonology -     DG Chest 2 View; Future  Other fatigue -     Vitamin B12  Apnea -     Ambulatory referral to Pulmonology  Family history of cardiomyopathy -     Ambulatory referral to Cardiology -     DG Chest 2 View; Future    Return in about 3 months (around 08/09/2023), or Stop low-dose levothyroxine.Mliss Sax, MD

## 2023-05-11 ENCOUNTER — Encounter: Payer: Self-pay | Admitting: Family Medicine

## 2023-05-12 LAB — VITAMIN B12: Vitamin B-12: 1035 pg/mL — ABNORMAL HIGH (ref 211–911)

## 2023-06-03 ENCOUNTER — Other Ambulatory Visit: Payer: Self-pay | Admitting: Family Medicine

## 2023-06-03 DIAGNOSIS — R5383 Other fatigue: Secondary | ICD-10-CM

## 2023-06-07 ENCOUNTER — Encounter: Payer: Self-pay | Admitting: Cardiovascular Disease

## 2023-06-07 ENCOUNTER — Ambulatory Visit: Payer: BC Managed Care – PPO | Attending: Cardiovascular Disease | Admitting: Cardiovascular Disease

## 2023-06-07 VITALS — BP 104/78 | HR 94 | Ht 63.0 in | Wt 196.6 lb

## 2023-06-07 DIAGNOSIS — E782 Mixed hyperlipidemia: Secondary | ICD-10-CM | POA: Diagnosis not present

## 2023-06-07 DIAGNOSIS — Z8249 Family history of ischemic heart disease and other diseases of the circulatory system: Secondary | ICD-10-CM

## 2023-06-07 DIAGNOSIS — R0609 Other forms of dyspnea: Secondary | ICD-10-CM

## 2023-06-07 DIAGNOSIS — I10 Essential (primary) hypertension: Secondary | ICD-10-CM | POA: Diagnosis not present

## 2023-06-07 NOTE — Patient Instructions (Signed)
Medication Instructions:  Your physician recommends that you continue on your current medications as directed. Please refer to the Current Medication list given to you today.  *If you need a refill on your cardiac medications before your next appointment, please call your pharmacy*   Testing/Procedures: Your physician has requested that you have an echocardiogram. Echocardiography is a painless test that uses sound waves to create images of your heart. It provides your doctor with information about the size and shape of your heart and how well your heart's chambers and valves are working. This procedure takes approximately one hour. There are no restrictions for this procedure. Please do NOT wear cologne, perfume, aftershave, or lotions (deodorant is allowed). Please arrive 15 minutes prior to your appointment time.   Dr. Allyson Sabal has ordered a CT coronary calcium score.   Test locations:  [facility]  [facility] at [facility]  This is $99 out of pocket.   Coronary CalciumScan A coronary calcium scan is an imaging test used to look for deposits of calcium and other fatty materials (plaques) in the inner lining of the blood vessels of the heart (coronary arteries). These deposits of calcium and plaques can partly clog and narrow the coronary arteries without producing any symptoms or warning signs. This puts a person at risk for a heart attack. This test can detect these deposits before symptoms develop. Tell a health care provider about: Any allergies you have. All medicines you are taking, including vitamins, herbs, eye drops, creams, and over-the-counter medicines. Any problems you or family members have had with anesthetic medicines. Any blood disorders you have. Any surgeries you have had. Any medical conditions you have. Whether you are pregnant or may be pregnant. What are the risks? Generally, this is  a safe procedure. However, problems may occur, including: Harm to a pregnant woman and her unborn baby. This test involves the use of radiation. Radiation exposure can be dangerous to a pregnant woman and her unborn baby. If you are pregnant, you generally should not have this procedure done. Slight increase in the risk of cancer. This is because of the radiation involved in the test. What happens before the procedure? No preparation is needed for this procedure. What happens during the procedure? You will undress and remove any jewelry around your neck or chest. You will put on a hospital gown. Sticky electrodes will be placed on your chest. The electrodes will be connected to an electrocardiogram (ECG) machine to record a tracing of the electrical activity of your heart. A CT scanner will take pictures of your heart. During this time, you will be asked to lie still and hold your breath for 2-3 seconds while a picture of your heart is being taken. The procedure may vary among health care providers and hospitals. What happens after the procedure? You can get dressed. You can return to your normal activities. It is up to you to get the results of your test. Ask your health care provider, or the department that is doing the test, when your results will be ready. Summary A coronary calcium scan is an imaging test used to look for deposits of calcium and other fatty materials (plaques) in the inner lining of the blood vessels of the heart (coronary arteries). Generally, this is a safe procedure. Tell your health care provider if you are pregnant or may be pregnant. No preparation is needed for this procedure. A CT scanner will take  pictures of your heart. You can return to your normal activities after the scan is done. This information is not intended to replace advice given to you by your health care provider. Make sure you discuss any questions you have with your health care provider. Document  Released: 03/03/2008 Document Revised: 07/25/2016 Document Reviewed: 07/25/2016 Elsevier Interactive Patient Education  2017 [employer].     Follow-Up: At [facility], you and your health needs are our priority.  As part of our continuing mission to provide you with exceptional heart care, we have created designated Provider Care Teams.  These Care Teams include your primary Cardiologist (physician) and Advanced Practice Providers (APPs -  Physician Assistants and Nurse Practitioners) who all work together to provide you with the care you need, when you need it.  We recommend signing up for the patient portal called "MyChart".  Sign up information is provided on this After Visit Summary.  MyChart is used to connect with patients for Virtual Visits (Telemedicine).  Patients are able to view lab/test results, encounter notes, upcoming appointments, etc.  Non-urgent messages can be sent to your provider as well.   To learn more about what you can do with MyChart, go to [email or web address].    Your next appointment:   5-6 week(s)  Provider:   Nanetta Batty, MD

## 2023-06-07 NOTE — Assessment & Plan Note (Signed)
Emily Stephens is complain of dyspnea on exertion for the last year or so.  She has no history of tobacco abuse.  She is fairly sedentary and probably deconditioned as well as moderately obese which is probably contributory.  Her PCP has mentioned workup for sleep apnea which they will pursue.  I am going to get a 2D echo to further evaluate including ruling out hypertrophic cardiomyopathy given her family history.

## 2023-06-07 NOTE — Assessment & Plan Note (Signed)
History of hyperlipidemia on statin therapy with lipid profile performed 04/28/2023 revealing total cholesterol 157, LDL of 78 and HDL 45.

## 2023-06-07 NOTE — Assessment & Plan Note (Signed)
Brother apparently has hypertrophic cardiomyopathy.  He has an ICD implanted and has had A-fib ablation.

## 2023-06-07 NOTE — Assessment & Plan Note (Signed)
History of essential hypertension blood pressure measured today at 104/78.  She is on Maxide.

## 2023-06-07 NOTE — Progress Notes (Signed)
06/07/2023 Emily Stephens   Sep 12, 1961  [id number]  Primary Physician Mliss Sax, MD Primary Cardiologist: Runell Gess MD Nicholes Calamity, [place]  HPI:  Emily Stephens is a 62 y.o. 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  She was referred by her PCP, Dr. Doreene Burke, for evaluation of dyspnea.  Risk factors include treated hypertension and hyperlipidemia.  Her brother apparently has hypertrophic cardiomyopathy, has had an ICD implanted and A-fib ablation.  She has had a left total knee replacement 02/23/2023 and apparently needs a right done in the near future.  She has never had a heart attack or stroke.  She denies chest pain but has had increasing dyspnea on exertion for unclear reasons.   Current Meds  Medication Sig   acetaminophen (TYLENOL) 500 MG tablet Take 500 mg by mouth every 6 (six) hours as needed.   Cholecalciferol (VITAMIN D3 PO) Take by mouth daily.   estradiol (ESTRACE) 0.1 MG/GM vaginal cream daily as needed.   FLUoxetine (PROZAC) 20 MG tablet Take 1 tablet (20 mg total) by mouth daily.   Homeopathic Products (ARNICA EX) Apply topically.   Misc Natural Products (JOINT SUPPORT PO) Take by mouth.   Multiple Vitamin (MULTIVITAMIN) capsule Take 1 capsule by mouth daily.   pravastatin (PRAVACHOL) 40 MG tablet TAKE 1 TABLET(40 MG) BY MOUTH DAILY   triamterene-hydrochlorothiazide (MAXZIDE) 75-50 MG tablet TAKE 1 TABLET BY MOUTH DAILY     No Known Allergies  Social History   Socioeconomic History   Marital status: Married    Spouse name: Emily Stephens   Number of children: 2   Years of education: 14+   Highest education level: Bachelor's degree (e.g., BA, AB, BS)  Occupational History   Occupation: TEACHER    Employer: OTHER  Tobacco Use   Smoking status: Never   Smokeless tobacco: Never  Vaping Use    Vaping status: Never Used  Substance and Sexual Activity   Alcohol use: Yes    Alcohol/week: 3.0 standard drinks of alcohol    Types: 3 Standard drinks or equivalent per week    Comment: occ   Drug use: No   Sexual activity: Not on file  Other Topics Concern   Not on file  Social History Narrative   Marital Status: Married Emily Stephens)   Children:  Emily Stephens, Emily Stephens)  Emily Stephens)    Pets:  Dog    Living Situation: Lives with spouse and children    Occupation: Runner, 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    Education:  Engineer, maintenance (IT) ([employer])    Alcohol Use:  Occasional   Drug Use:  None   Diet:  Regular   Exercise:  Walking   Hobbies: Social research officer, government, Reading, Film/video editor              Social Determinants of Health   Financial Resource Strain: Low Risk  (04/23/2023)   Overall Financial Resource Strain (CARDIA)    Difficulty of Paying Living Expenses: Not hard at all  Food Insecurity: No Food Insecurity (04/23/2023)   Hunger Vital Sign    Worried About Running Out of Food in the Last Year: Never true    Ran Out of Food in the Last Year: Never true  Transportation Needs: No Transportation Needs (04/23/2023)   PRAPARE - Administrator, Civil Service (Medical): No  Lack of Transportation (Non-Medical): No  Physical Activity: Insufficiently Active (04/23/2023)   Exercise Vital Sign    Days of Exercise per Week: 5 days    Minutes of Exercise per Session: 10 min  Stress: No Stress Concern Present (04/23/2023)   [employer] of Occupational Health - Occupational Stress Questionnaire    Feeling of Stress : Only a little  Social Connections: Unknown (04/23/2023)   Social Connection and Isolation Panel [NHANES]    Frequency of Communication with Friends and Family: More than three times a week    Frequency of Social Gatherings with Friends and Family: Once a week    Attends Religious Services: Patient declined    Database administrator or Organizations: Patient declined     Attends Engineer, structural: Not on file    Marital Status: Married  Catering manager Violence: Not on file     Review of Systems: General: negative for chills, fever, night sweats or weight changes.  Cardiovascular: negative for chest pain, dyspnea on exertion, edema, orthopnea, palpitations, paroxysmal nocturnal dyspnea or shortness of breath Dermatological: negative for rash Respiratory: negative for cough or wheezing Urologic: negative for hematuria Abdominal: negative for nausea, vomiting, diarrhea, bright red blood per rectum, melena, or hematemesis Neurologic: negative for visual changes, syncope, or dizziness All other systems reviewed and are otherwise negative except as noted above.    Blood pressure 104/78, pulse 94, height 5\' 3"  (1.6 m), weight 196 lb 9.6 oz (89.2 kg), last menstrual period 10/28/2013, SpO2 96%.  General appearance: alert and no distress Neck: no adenopathy, no carotid bruit, no JVD, supple, symmetrical, trachea midline, and thyroid not enlarged, symmetric, no tenderness/mass/nodules Lungs: clear to auscultation bilaterally Heart: regular rate and rhythm, S1, S2 normal, no murmur, click, rub or gallop Extremities: extremities normal, atraumatic, no cyanosis or edema Pulses: 2+ and symmetric Skin: Skin color, texture, turgor normal. No rashes or lesions Neurologic: Grossly normal  EKG EKG Interpretation Date/Time:  Wednesday June 07 2023 15:18:58 EDT Ventricular Rate:  79 PR Interval:  164 QRS Duration:  90 QT Interval:  392 QTC Calculation: 449 R Axis:   -21  Text Interpretation: Normal sinus rhythm Septal infarct , age undetermined No previous ECGs available Confirmed by Nanetta Batty [phone]) on 06/07/2023 3:24:25 PM    ASSESSMENT AND PLAN:   Family history of heart disease Brother apparently has hypertrophic cardiomyopathy.  He has an ICD implanted and has had A-fib ablation.  Mixed hyperlipidemia History of hyperlipidemia on  statin therapy with lipid profile performed 04/28/2023 revealing total cholesterol 157, LDL of 78 and HDL 45.  Hypertensive disorder History of essential hypertension blood pressure measured today at 104/78.  She is on Maxide.  Dyspnea on exertion Ms. Sajous is complain of dyspnea on exertion for the last year or so.  She has no history of tobacco abuse.  She is fairly sedentary and probably deconditioned as well as moderately obese which is probably contributory.  Her PCP has mentioned workup for sleep apnea which they will pursue.  I am going to get a 2D echo to further evaluate including ruling out hypertrophic cardiomyopathy given her family history.     Runell Gess MD FACP,FACC,FAHA, [facility] 06/07/2023 3:36 PM

## 2023-06-16 ENCOUNTER — Ambulatory Visit (HOSPITAL_COMMUNITY)
Admission: RE | Admit: 2023-06-16 | Discharge: 2023-06-16 | Disposition: A | Discharge status: Home / Self Care | Payer: BC Managed Care – PPO | Source: Ambulatory Visit | Attending: Cardiovascular Disease | Admitting: Cardiovascular Disease

## 2023-06-16 DIAGNOSIS — R0609 Other forms of dyspnea: Secondary | ICD-10-CM | POA: Insufficient documentation

## 2023-06-16 DIAGNOSIS — Z8249 Family history of ischemic heart disease and other diseases of the circulatory system: Secondary | ICD-10-CM | POA: Insufficient documentation

## 2023-06-16 DIAGNOSIS — E782 Mixed hyperlipidemia: Secondary | ICD-10-CM | POA: Insufficient documentation

## 2023-06-16 DIAGNOSIS — I10 Essential (primary) hypertension: Secondary | ICD-10-CM | POA: Insufficient documentation

## 2023-06-20 ENCOUNTER — Ambulatory Visit (HOSPITAL_COMMUNITY): Payer: BC Managed Care – PPO | Attending: Cardiovascular Disease

## 2023-06-20 DIAGNOSIS — E782 Mixed hyperlipidemia: Secondary | ICD-10-CM | POA: Diagnosis present

## 2023-06-20 DIAGNOSIS — I10 Essential (primary) hypertension: Secondary | ICD-10-CM | POA: Insufficient documentation

## 2023-06-20 DIAGNOSIS — Z8249 Family history of ischemic heart disease and other diseases of the circulatory system: Secondary | ICD-10-CM | POA: Insufficient documentation

## 2023-06-20 DIAGNOSIS — R0609 Other forms of dyspnea: Secondary | ICD-10-CM | POA: Diagnosis present

## 2023-06-20 LAB — ECHOCARDIOGRAM COMPLETE
Area-P 1/2: 3.12 cm2
S' Lateral: 2.8 cm

## 2023-07-14 ENCOUNTER — Ambulatory Visit: Payer: BC Managed Care – PPO | Attending: Cardiovascular Disease | Admitting: Cardiovascular Disease

## 2023-07-14 ENCOUNTER — Encounter: Payer: Self-pay | Admitting: Cardiovascular Disease

## 2023-07-14 VITALS — BP 122/76 | HR 78 | Ht 63.0 in | Wt 193.8 lb

## 2023-07-14 DIAGNOSIS — E782 Mixed hyperlipidemia: Secondary | ICD-10-CM | POA: Diagnosis not present

## 2023-07-14 DIAGNOSIS — R0789 Other chest pain: Secondary | ICD-10-CM | POA: Diagnosis not present

## 2023-07-14 NOTE — Patient Instructions (Signed)
Medication Instructions:  Your physician recommends that you continue on your current medications as directed. Please refer to the Current Medication list given to you today.  *If you need a refill on your cardiac medications before your next appointment, please call your pharmacy*  Lab Work: Fasting Lipid Panel and Liver Panel in 3 Months  If you have labs (blood work) drawn today and your tests are completely normal, you will receive your results only by: MyChart Message (if you have MyChart) OR A paper copy in the mail If you have any lab test that is abnormal or we need to change your treatment, we will call you to review the results.  Follow-Up: At [facility], you and your health needs are our priority.  As part of our continuing mission to provide you with exceptional heart care, we have created designated Provider Care Teams.  These Care Teams include your primary Cardiologist (physician) and Advanced Practice Providers (APPs -  Physician Assistants and Nurse Practitioners) who all work together to provide you with the care you need, when you need it.  Your next appointment:   4 month(s)  Provider:   Dr. Nanetta Batty

## 2023-07-14 NOTE — Progress Notes (Signed)
Emily Stephens returns today for follow-up of her outpatient diagnostic test performed in evaluation of chest pain and shortness of breath.  She is accompanied by her husband Emily Stephens today.  Her 2D echo was essentially normal except for mild concentric LVH and grade 1 diastolic dysfunction.  Her coronary calcium score was 0.  She did have a total knee replacement on the right several weeks ago.  She still has some swelling in her right leg.  She stopped her statin and diuretic and as result her symptoms have completely resolved.  Her last lipid profile performed 04/28/2023 revealed an LDL of 78.  I am going to recheck a lipid liver profile 3 months I will see her back in 4 months for follow-up.  Runell Gess, M.D., FACP, [facility], [facility] [address] [place], [place]  [address]  [phone] 07/14/2023 12:10 PM

## 2023-08-04 ENCOUNTER — Other Ambulatory Visit: Payer: Self-pay | Admitting: Obstetrics and Gynecology

## 2023-08-04 DIAGNOSIS — Z0000 Encounter for general adult medical examination without abnormal findings: Secondary | ICD-10-CM

## 2023-08-21 ENCOUNTER — Encounter: Payer: Self-pay | Admitting: Family Medicine

## 2023-08-21 ENCOUNTER — Ambulatory Visit: Payer: BC Managed Care – PPO | Admitting: Family Medicine

## 2023-08-21 VITALS — BP 124/78 | HR 74 | Temp 98.0°F | Wt 196.8 lb

## 2023-08-21 DIAGNOSIS — J029 Acute pharyngitis, unspecified: Secondary | ICD-10-CM | POA: Diagnosis not present

## 2023-08-21 DIAGNOSIS — J020 Streptococcal pharyngitis: Secondary | ICD-10-CM | POA: Insufficient documentation

## 2023-08-21 DIAGNOSIS — R059 Cough, unspecified: Secondary | ICD-10-CM | POA: Diagnosis not present

## 2023-08-21 DIAGNOSIS — R051 Acute cough: Secondary | ICD-10-CM

## 2023-08-21 DIAGNOSIS — R0981 Nasal congestion: Secondary | ICD-10-CM

## 2023-08-21 LAB — POC COVID19 BINAXNOW: SARS Coronavirus 2 Ag: NEGATIVE

## 2023-08-21 LAB — POCT INFLUENZA A/B
Influenza A, POC: NEGATIVE
Influenza B, POC: NEGATIVE

## 2023-08-21 LAB — POCT RAPID STREP A (OFFICE): Rapid Strep A Screen: POSITIVE — AB

## 2023-08-21 MED ORDER — PROMETHAZINE-DM 6.25-15 MG/5ML PO SYRP: 5.0000 mL | ORAL_SOLUTION | Freq: Four times a day (QID) | ORAL | 0 refills | Status: DC | PRN

## 2023-08-21 MED ORDER — FLUTICASONE PROPIONATE 50 MCG/ACT NA SUSP: 2.0000 | Freq: Every day | NASAL | 6 refills | Status: DC

## 2023-08-21 MED ORDER — AMOXICILLIN 500 MG PO CAPS: 500.0000 mg | ORAL_CAPSULE | Freq: Three times a day (TID) | ORAL | 0 refills | Status: AC

## 2023-08-21 NOTE — Progress Notes (Signed)
Assessment/Plan:   Problem List Items Addressed This Visit       Respiratory   Strep pharyngitis    Start Amoxicillin 500?mg orally three times daily for 10 days. Begin Fluticasone nasal spray for congestion. Prescribe Promethazine DM syrup for cough and to aid sleep. Continue Tylenol Arthritis as needed for pain. Continue Celebrex; advise against using ibuprofen concurrently. Follow up if symptoms do not improve or worsen.      Relevant Medications   amoxicillin (AMOXIL) 500 MG capsule   Other Visit Diagnoses     Sore throat    -  Primary   Relevant Orders   POCT rapid strep A (Completed)   Cough, unspecified type       Relevant Orders   POC COVID-19 BinaxNow   POCT Influenza A/B   Acute cough       Relevant Medications   promethazine-dextromethorphan (PROMETHAZINE-DM) 6.25-15 MG/5ML syrup   Nasal congestion       Relevant Medications   fluticasone (FLONASE) 50 MCG/ACT nasal spray       Medications Discontinued During This Encounter  Medication Reason   FLUoxetine (PROZAC) 20 MG tablet    Multiple Vitamin (MULTIVITAMIN) capsule     No follow-ups on file.    Subjective:   Encounter date: 08/21/2023  Emily Stephens is a 62 y.o. female who has Excessive bleeding in premenopausal period; Flushing; Heart burn; Lower back pain; Sciatica of left side; Hip pain, bilateral; Lumbago; Acute medial meniscus tear of left knee; Acute pain of left knee; Biceps tendinitis; Carpal tunnel syndrome of right wrist; Gastroesophageal reflux disease without esophagitis; Mixed hyperlipidemia; Pain in right hand; Primary osteoarthritis of left knee; Shoulder pain; Hepatitis B core antibody positive; Elevated cholesterol; Depression with anxiety; Need for shingles vaccine; Body mass index (BMI) 36.0-36.9, adult; Elevated blood-pressure reading, without diagnosis of hypertension; Hypertensive disorder; Neoplasm of meninges; Perennial allergic rhinitis with seasonal variation; Scar;  Sebaceous cyst; Other fatigue; Hair loss; Urinary tract infection with hematuria; Snores; Morbid obesity (HCC); Renal insufficiency; Acute non-recurrent sinusitis; Dysfunction of right eustachian tube; Ecchymosis of forearm; Hirsutism; Arthritis of knee, right; Apnea; Dyspnea on exertion; Family history of heart disease; and Strep pharyngitis on their problem list..   She  has a past medical history of Allergy, Arthritis, Bone spur, Cataract, Depression, Dermatophytosis of nail, Essential hypertension, benign, Hyperlipidemia, Menorrhagia, Obesity, Osteopenia, Other and unspecified hyperlipidemia, Pain in joint, ankle and foot, and Unspecified vitamin D deficiency..   Subjective: Patient presents with a 5-day history of cough, body aches, fatigue, sore throat, and nasal congestion. Throat pain is severe upon waking, and she has difficulty sleeping due to coughing and throat discomfort. Reports sharp pain on the left side of her head. Denies chest pain or shortness of breath. Experienced abdominal pain last night, now resolved. 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  Current treatments for illness: Mucinex, nighttime cold medication, cough drops, Arm & Hammer saline nasal spray, Tylenol Arthritis, and Celebrex.     Past Surgical History:  Procedure Laterality Date   APPENDECTOMY     COLONOSCOPY  2012   WNL-Dr.Peters   MENISCUS REPAIR Left 2019   TONSILLECTOMY      Outpatient Medications Prior to Visit  Medication Sig Dispense Refill   acetaminophen (TYLENOL) 500 MG tablet Take 500 mg by mouth every 6 (six) hours as needed.     CELEBREX 100 MG capsule Take 100 mg by mouth 2 (  two) times daily. As needed for swelling     gabapentin (NEURONTIN) 100 MG capsule Take 100 mg by mouth every 8 (eight) hours.     methocarbamol (ROBAXIN) 500 MG tablet 500 mg.     aspirin 81 MG chewable tablet Chew 81 mg by mouth 2  (two) times daily. (Patient not taking: Reported on 08/21/2023)     Cholecalciferol (VITAMIN D3 PO) Take by mouth daily. (Patient not taking: Reported on 07/14/2023)     estradiol (ESTRACE) 0.1 MG/GM vaginal cream daily as needed. (Patient not taking: Reported on 07/14/2023)     Homeopathic Products (ARNICA EX) Apply topically. (Patient not taking: Reported on 07/14/2023)     Misc Natural Products (JOINT SUPPORT PO) Take by mouth. (Patient not taking: Reported on 07/14/2023)     oxyCODONE (OXY IR/ROXICODONE) 5 MG immediate release tablet Take 5 mg by mouth every 6 (six) hours as needed. (Patient not taking: Reported on 08/21/2023)     pravastatin (PRAVACHOL) 40 MG tablet TAKE 1 TABLET(40 MG) BY MOUTH DAILY (Patient not taking: Reported on 07/14/2023) 90 tablet 3   triamterene-hydrochlorothiazide (MAXZIDE) 75-50 MG tablet TAKE 1 TABLET BY MOUTH DAILY (Patient not taking: Reported on 07/14/2023) 90 tablet 1   FLUoxetine (PROZAC) 20 MG tablet Take 1 tablet (20 mg total) by mouth daily. 90 tablet 3   Multiple Vitamin (MULTIVITAMIN) capsule Take 1 capsule by mouth daily.     No facility-administered medications prior to visit.    Family History  Problem Relation Age of Onset   Lung disease Mother    Osteoporosis Mother    Colon polyps Mother 22   CVA Father    Heart disease Brother    Diabetes Maternal Aunt    Breast cancer Maternal Aunt    Diabetes Maternal Uncle    Aneurysm Sister    Breast cancer Cousin    Esophageal cancer Neg Hx    Colon cancer Neg Hx    Stomach cancer Neg Hx    Rectal cancer Neg Hx     Social History   Socioeconomic History   Marital status: Married    Spouse name: DENNIS   Number of children: 2   Years of education: 14+   Highest education level: Bachelor's degree (e.g., BA, AB, BS)  Occupational History   Occupation: TEACHER    Employer: OTHER  Tobacco Use   Smoking status: Never   Smokeless tobacco: Never  Vaping Use   Vaping status: Never Used   Substance and Sexual Activity   Alcohol use: Yes    Alcohol/week: 3.0 standard drinks of alcohol    Types: 3 Standard drinks or equivalent per week    Comment: occ   Drug use: No   Sexual activity: Not on file  Other Topics Concern   Not on file  Social History Narrative   Marital Status: Married Maurine Minister)   Children:  Son Designer, industrial/product)  Daughter Maralyn Sago)    Pets:  Dog    Living Situation: Lives with spouse and children    Occupation: Runner, 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    Education:  Engineer, maintenance (IT) ([employer])    Alcohol Use:  Occasional   Drug Use:  None   Diet:  Regular   Exercise:  Walking   Hobbies: Social research officer, government, Reading, Film/video editor              Social Determinants of Health   Financial Resource Strain: Low Risk  (04/23/2023)   Overall Financial Resource Strain (CARDIA)  Difficulty of Paying Living Expenses: Not hard at all  Food Insecurity: No Food Insecurity (04/23/2023)   Hunger Vital Sign    Worried About Running Out of Food in the Last Year: Never true    Ran Out of Food in the Last Year: Never true  Transportation Needs: No Transportation Needs (04/23/2023)   PRAPARE - Administrator, Civil Service (Medical): No    Lack of Transportation (Non-Medical): No  Physical Activity: Insufficiently Active (04/23/2023)   Exercise Vital Sign    Days of Exercise per Week: 5 days    Minutes of Exercise per Session: 10 min  Stress: No Stress Concern Present (04/23/2023)   [employer] of Occupational Health - Occupational Stress Questionnaire    Feeling of Stress : Only a little  Social Connections: Unknown (04/23/2023)   Social Connection and Isolation Panel [NHANES]    Frequency of Communication with Friends and Family: More than three times a week    Frequency of Social Gatherings with Friends and Family: Once a week    Attends Religious Services: Patient declined    Database administrator or Organizations: Patient declined    Attends Hospital doctor: Not on file    Marital Status: Married  Catering manager Violence: Not on file                                                                                                  Objective:  Physical Exam: BP 124/78 (BP Location: Left Arm, Patient Position: Sitting, Cuff Size: Large)   Pulse 74   Temp 98 F (36.7 C) (Oral)   Wt 196 lb 12.8 oz (89.3 kg)   LMP 10/28/2013   SpO2 96%   BMI 34.86 kg/m     Physical Exam Constitutional:      General: She is not in acute distress.    Appearance: Normal appearance. She is not ill-appearing or toxic-appearing.  HENT:     Head: Normocephalic and atraumatic.     Right Ear: Tympanic membrane and ear canal normal.     Left Ear: Tympanic membrane and ear canal normal.     Nose: Congestion present.     Right Sinus: Maxillary sinus tenderness and frontal sinus tenderness present.     Left Sinus: Maxillary sinus tenderness and frontal sinus tenderness present.     Mouth/Throat:     Mouth: Mucous membranes are moist.     Pharynx: Oropharyngeal exudate and posterior oropharyngeal erythema present.     Tonsils: Tonsillar exudate present.  Eyes:     General: No scleral icterus.    Extraocular Movements: Extraocular movements intact.  Cardiovascular:     Rate and Rhythm: Normal rate and regular rhythm.     Pulses: Normal pulses.     Heart sounds: Normal heart sounds.  Pulmonary:     Effort: Pulmonary effort is normal. No respiratory distress.     Breath sounds: Normal breath sounds.  Abdominal:     General: Abdomen is flat. Bowel sounds are normal.     Palpations: Abdomen is soft.  Musculoskeletal:  General: Normal range of motion.  Lymphadenopathy:     Cervical: No cervical adenopathy.  Skin:    General: Skin is warm and dry.     Findings: No rash.  Neurological:     General: No focal deficit present.     Mental Status: She is alert and oriented to person, place, and time. Mental status is at baseline.  Psychiatric:         Mood and Affect: Mood normal.        Behavior: Behavior normal.        Thought Content: Thought content normal.        Judgment: Judgment normal.     CT CARDIAC SCORING (SELF PAY ONLY)  Addendum Date: 07/07/2023   ADDENDUM REPORT: 07/07/2023 14:30 EXAM: OVER-READ INTERPRETATION  CT CHEST The following report is an over-read performed by radiologist Dr. Curly Shores [facility] on 07/07/2023. This over-read does not include interpretation of cardiac or coronary anatomy or pathology. The coronary CTA interpretation by the cardiologist is attached. COMPARISON:  None. FINDINGS: Cardiovascular:  See findings discussed in the body of the report. Mediastinum/Nodes: No suspicious adenopathy identified. Imaged mediastinal structures are unremarkable. Lungs/Pleura: Minimal linear subsegmental atelectasis or scarring mid lungs. Imaged lungs are otherwise clear. No pleural effusion or pneumothorax. Upper Abdomen: No acute abnormality. Musculoskeletal: No chest wall abnormality. No acute osseous findings. IMPRESSION: No acute extracardiac incidental findings identified. Electronically Signed   By: Layla Maw M.D.   On: 07/07/2023 14:30   Result Date: 07/07/2023 CLINICAL DATA:  Cardiovascular Disease Risk stratification EXAM: Coronary Calcium Score TECHNIQUE: A gated, non-contrast computed tomography scan of the heart was performed using 3mm slice thickness. Axial images were analyzed on a dedicated workstation. Calcium scoring of the coronary arteries was performed using the Agatston method. FINDINGS: Coronary arteries: Normal origins. Coronary Calcium Score: Left main: 0 Left anterior descending artery: 0 Left circumflex artery: 0 Right coronary artery: 0 Total: 0 Percentile: <1 Pericardium: Normal. Ascending Aorta: Normal caliber. Mild mitral annular calcification. Non-cardiac: See separate report from [facility]. IMPRESSION: 1. Coronary calcium score of 0. This was <1  percentile for age-, race-, and sex-matched controls. 2.  Mild mitral annular calcification. RECOMMENDATIONS: Coronary artery calcium (CAC) score is a strong predictor of incident coronary heart disease (CHD) and provides predictive information beyond traditional risk factors. CAC scoring is reasonable to use in the decision to withhold, postpone, or initiate statin therapy in intermediate-risk or selected borderline-risk asymptomatic adults (age 29-75 years and LDL-C >=70 to <190 mg/dL) who do not have diabetes or established atherosclerotic cardiovascular disease (ASCVD).* In intermediate-risk (10-year ASCVD risk >=7.5% to <20%) adults or selected borderline-risk (10-year ASCVD risk >=5% to <7.5%) adults in whom a CAC score is measured for the purpose of making a treatment decision the following recommendations have been made: If CAC=0, it is reasonable to withhold statin therapy and reassess in 5 to 10 years, as long as higher risk conditions are absent (diabetes mellitus, family history of premature CHD in first degree relatives (males <55 years; females <65 years), cigarette smoking, or LDL >=190 mg/dL). If CAC is 1 to 99, it is reasonable to initiate statin therapy for patients >=57 years of age. If CAC is >=100 or >=75th percentile, it is reasonable to initiate statin therapy at any age. Cardiology referral should be considered for patients with CAC scores >=400 or >=75th percentile. *2018 AHA/ACC/AACVPR/AAPA/ABC/ACPM/ADA/AGS/APhA/ASPC/NLA/PCNA Guideline on the Management of Blood Cholesterol: A Report of the American College of Cardiology/American Heart Association  Task Force on Clinical Practice Guidelines. J Am Coll Cardiol. 2019;73(24):3168-3209. Electronically Signed: By: Donato Schultz M.D. On: 06/16/2023 12:07   ECHOCARDIOGRAM COMPLETE  Result Date: 06/20/2023    ECHOCARDIOGRAM REPORT   Patient Name:   ZURIANA MCGURL Date of Exam: 06/20/2023 Medical Rec #:  [id number]       Height:       63.0 in  Accession #:    [id number]      Weight:       196.6 lb Date of Birth:  Jan 31, 1961       BSA:          1.920 m Patient Age:    62 years        BP:           143/90 mmHg Patient Gender: F               HR:           67 bpm. Exam Location:  [address] Procedure: 2D Echo, 3D Echo, Cardiac Doppler, Color Doppler and Strain Analysis Indications:    R06.00 Dyspnea  History:        Patient has no prior history of Echocardiogram examinations.                 Risk Factors:Family History of Coronary Artery Disease,                 Hypertension and HLD.  Sonographer:    Clearence Ped RCS Referring Phys: [id number] JONATHAN J BERRY IMPRESSIONS  1. Left ventricular ejection fraction, by estimation, is 60 to 65%. The left ventricle has normal function. The left ventricle has no regional wall motion abnormalities. There is mild concentric left ventricular hypertrophy. Left ventricular diastolic parameters are consistent with Grade I diastolic dysfunction (impaired relaxation).  2. Right ventricular systolic function is normal. The right ventricular size is normal. Tricuspid regurgitation signal is inadequate for assessing PA pressure.  3. The mitral valve is normal in structure. Trivial mitral valve regurgitation. No evidence of mitral stenosis.  4. The aortic valve is tricuspid. There is mild calcification of the aortic valve. Aortic valve regurgitation is not visualized. No aortic stenosis is present.  5. The inferior vena cava is normal in size with greater than 50% respiratory variability, suggesting right atrial pressure of 3 mmHg. FINDINGS  Left Ventricle: Left ventricular ejection fraction, by estimation, is 60 to 65%. The left ventricle has normal function. The left ventricle has no regional wall motion abnormalities. The left ventricular internal cavity size was normal in size. There is  mild concentric left ventricular hypertrophy. Left ventricular diastolic parameters are consistent with Grade I diastolic dysfunction  (impaired relaxation). Right Ventricle: The right ventricular size is normal. No increase in right ventricular wall thickness. Right ventricular systolic function is normal. Tricuspid regurgitation signal is inadequate for assessing PA pressure. The tricuspid regurgitant velocity is 0.87 m/s, and with an assumed right atrial pressure of 3 mmHg, the estimated right ventricular systolic pressure is 6.1 mmHg. Left Atrium: Left atrial size was normal in size. Right Atrium: Right atrial size was normal in size. Pericardium: There is no evidence of pericardial effusion. Mitral Valve: The mitral valve is normal in structure. Mild to moderate mitral annular calcification. Trivial mitral valve regurgitation. No evidence of mitral valve stenosis. Tricuspid Valve: The tricuspid valve is normal in structure. Tricuspid valve regurgitation is not demonstrated. Aortic Valve: The aortic valve is tricuspid. There is mild calcification of the aortic valve. Aortic  valve regurgitation is not visualized. No aortic stenosis is present. Pulmonic Valve: The pulmonic valve was normal in structure. Pulmonic valve regurgitation is not visualized. Aorta: The aortic root is normal in size and structure. Venous: The inferior vena cava is normal in size with greater than 50% respiratory variability, suggesting right atrial pressure of 3 mmHg. IAS/Shunts: No atrial level shunt detected by color flow Doppler.  LEFT VENTRICLE PLAX 2D LVIDd:         3.90 cm   Diastology LVIDs:         2.80 cm   LV e' medial:    7.62 cm/s LV PW:         1.00 cm   LV E/e' medial:  12.5 LV IVS:        1.20 cm   LV e' lateral:   10.10 cm/s LVOT diam:     2.00 cm   LV E/e' lateral: 9.4 LV SV:         82 LV SV Index:   43 LVOT Area:     3.14 cm                           3D Volume EF:                          3D EF:        61 %                          LV EDV:       91 ml                          LV ESV:       36 ml                          LV SV:        55 ml RIGHT  VENTRICLE RV Basal diam:  2.70 cm RV S prime:     17.60 cm/s TAPSE (M-mode): 2.4 cm RVSP:           6.1 mmHg LEFT ATRIUM             Index        RIGHT ATRIUM           Index LA diam:        3.60 cm 1.88 cm/m   RA Pressure: 3.00 mmHg LA Vol (A2C):   56.9 ml 29.64 ml/m  RA Area:     12.80 cm LA Vol (A4C):   42.6 ml 22.19 ml/m  RA Volume:   25.00 ml  13.02 ml/m LA Biplane Vol: 51.6 ml 26.88 ml/m  AORTIC VALVE LVOT Vmax:   117.00 cm/s LVOT Vmean:  84.400 cm/s LVOT VTI:    0.260 m  AORTA Ao Root diam: 3.20 cm Ao Asc diam:  3.50 cm MITRAL VALVE                TRICUSPID VALVE MV Area (PHT):              TR Peak grad:   3.1 mmHg MV Decel Time:              TR Vmax:        87.40 cm/s MV E velocity: 95.40 cm/s   Estimated  RAP:  3.00 mmHg MV A velocity: 108.00 cm/s  RVSP:           6.1 mmHg MV E/A ratio:  0.88                             SHUNTS                             Systemic VTI:  0.26 m                             Systemic Diam: 2.00 cm Dalton McleanMD Electronically signed by Wilfred Lacy Signature Date/Time: 06/20/2023/4:10:56 PM    Final     Recent Results (from the past 2160 hour(s))  ECHOCARDIOGRAM COMPLETE     Status: None   Collection Time: 06/20/23  4:03 PM  Result Value Ref Range   Area-P 1/2 3.12 cm2   S' Lateral 2.80 cm   Est EF 60 - 65%   POCT rapid strep A     Status: Abnormal   Collection Time: 08/21/23  3:18 PM  Result Value Ref Range   Rapid Strep A Screen Positive (A) Negative        Garner Nash, MD, MS

## 2023-08-21 NOTE — Assessment & Plan Note (Signed)
Start Amoxicillin 500?mg orally three times daily for 10 days. Begin Fluticasone nasal spray for congestion. Prescribe Promethazine DM syrup for cough and to aid sleep. Continue Tylenol Arthritis as needed for pain. Continue Celebrex; advise against using ibuprofen concurrently. Follow up if symptoms do not improve or worsen.

## 2023-09-04 ENCOUNTER — Ambulatory Visit: Payer: BC Managed Care – PPO

## 2023-09-16 ENCOUNTER — Other Ambulatory Visit: Payer: Self-pay | Admitting: Family Medicine

## 2023-09-16 DIAGNOSIS — I10 Essential (primary) hypertension: Secondary | ICD-10-CM

## 2023-09-20 HISTORY — PX: CATARACT EXTRACTION, BILATERAL: SHX1313

## 2023-09-28 ENCOUNTER — Ambulatory Visit
Admission: RE | Admit: 2023-09-28 | Discharge: 2023-09-28 | Disposition: A | Discharge status: Home / Self Care | Payer: 59 | Source: Ambulatory Visit | Attending: Obstetrics and Gynecology | Admitting: Obstetrics and Gynecology

## 2023-09-28 DIAGNOSIS — Z0000 Encounter for general adult medical examination without abnormal findings: Secondary | ICD-10-CM

## 2023-10-02 ENCOUNTER — Ambulatory Visit: Payer: 59 | Admitting: Family Medicine

## 2023-10-02 ENCOUNTER — Encounter: Payer: Self-pay | Admitting: Family Medicine

## 2023-10-02 VITALS — BP 122/76 | HR 64 | Temp 98.0°F | Ht 63.0 in | Wt 199.6 lb

## 2023-10-02 DIAGNOSIS — S39012A Strain of muscle, fascia and tendon of lower back, initial encounter: Secondary | ICD-10-CM | POA: Diagnosis not present

## 2023-10-02 MED ORDER — METHOCARBAMOL 500 MG PO TABS: ORAL_TABLET | ORAL | 1 refills | Status: DC

## 2023-10-02 MED ORDER — MELOXICAM 7.5 MG PO TABS: 7.5000 mg | ORAL_TABLET | Freq: Every day | ORAL | 1 refills | Status: DC

## 2023-10-02 NOTE — Progress Notes (Signed)
 Established Patient Office Visit   Subjective:  Patient ID: Emily Stephens, female    DOB: [birth year]  Age: 63 y.o. MRN: [id number]  Chief Complaint  Patient presents with   Back Pain    Lower back pain only while sleeping x 3 months.     Back Pain Pertinent negatives include no abdominal pain, tingling or weakness.   Encounter Diagnoses  Name Primary?   Strain of lumbar region, initial encounter Yes   Presents with a 2 to 34-month history of bilateral nonradiating lower back pain.  No specific injury.  Seem to start after her recent right total knee replacement.  Pain is worse at night when she may feel painful spasms.  Pain resolves during the day.  Ongoing paresthesias around the surgical wound associated with her knee replacement.  She does have intermittent tingling toes of her right foot.  There is no bowel or bladder incontinence.   Review of Systems  Constitutional: Negative.   HENT: Negative.    Eyes:  Negative for blurred vision, discharge and redness.  Respiratory: Negative.    Cardiovascular: Negative.   Gastrointestinal:  Negative for abdominal pain.  Genitourinary: Negative.   Musculoskeletal:  Positive for back pain. Negative for myalgias.  Skin:  Negative for rash.  Neurological:  Negative for tingling, loss of consciousness and weakness.  Endo/Heme/Allergies:  Negative for polydipsia.     Current Outpatient Medications:    acetaminophen  (TYLENOL ) 500 MG tablet, Take 500 mg by mouth every 6 (six) hours as needed., Disp: , Rfl:    fluticasone  (FLONASE ) 50 MCG/ACT nasal spray, Place 2 sprays into both nostrils daily., Disp: 16 g, Rfl: 6   meloxicam  (MOBIC ) 7.5 MG tablet, Take 1 tablet (7.5 mg total) by mouth daily., Disp: 30 tablet, Rfl: 1   methocarbamol  (ROBAXIN ) 500 MG tablet, .  Take 1 at night before bed, Disp: 30 tablet, Rfl: 1   aspirin 81 MG chewable tablet, Chew 81 mg by mouth 2 (two) times daily. (Patient not taking: Reported on 10/02/2023), Disp: ,  Rfl:    Cholecalciferol (VITAMIN D3 PO), Take by mouth daily. (Patient not taking: Reported on 10/02/2023), Disp: , Rfl:    estradiol (ESTRACE) 0.1 MG/GM vaginal cream, daily as needed. (Patient not taking: Reported on 10/02/2023), Disp: , Rfl:    gabapentin  (NEURONTIN ) 100 MG capsule, Take 100 mg by mouth every 8 (eight) hours. (Patient not taking: Reported on 10/02/2023), Disp: , Rfl:    Homeopathic Products (ARNICA EX), Apply topically. (Patient not taking: Reported on 10/02/2023), Disp: , Rfl:    Misc Natural Products (JOINT SUPPORT PO), Take by mouth. (Patient not taking: Reported on 10/02/2023), Disp: , Rfl:    oxyCODONE (OXY IR/ROXICODONE) 5 MG immediate release tablet, Take 5 mg by mouth every 6 (six) hours as needed. (Patient not taking: Reported on 10/02/2023), Disp: , Rfl:    pravastatin  (PRAVACHOL ) 40 MG tablet, TAKE 1 TABLET(40 MG) BY MOUTH DAILY (Patient not taking: Reported on 10/02/2023), Disp: 90 tablet, Rfl: 3   promethazine -dextromethorphan (PROMETHAZINE -DM) 6.25-15 MG/5ML syrup, Take 5 mLs by mouth 4 (four) times daily as needed. (Patient not taking: Reported on 10/02/2023), Disp: 118 mL, Rfl: 0   triamterene -hydrochlorothiazide (MAXZIDE) 75-50 MG tablet, TAKE 1 TABLET BY MOUTH DAILY (Patient not taking: Reported on 10/02/2023), Disp: 90 tablet, Rfl: 1   Objective:     BP 122/76   Pulse 64   Temp 98 F (36.7 C)   Ht 5' 3 (1.6 m)   Wt 199 lb  9.6 oz (90.5 kg)   LMP 10/28/2013   SpO2 96%   BMI 35.36 kg/m  Wt Readings from Last 3 Encounters:  10/02/23 199 lb 9.6 oz (90.5 kg)  08/21/23 196 lb 12.8 oz (89.3 kg)  07/14/23 193 lb 12.8 oz (87.9 kg)      Physical Exam Constitutional:      General: She is not in acute distress.    Appearance: Normal appearance. She is not ill-appearing, toxic-appearing or diaphoretic.  HENT:     Head: Normocephalic and atraumatic.     Right Ear: External ear normal.     Left Ear: External ear normal.  Eyes:     General: No scleral icterus.        Right eye: No discharge.        Left eye: No discharge.     Extraocular Movements: Extraocular movements intact.     Conjunctiva/sclera: Conjunctivae normal.  Pulmonary:     Effort: Pulmonary effort is normal. No respiratory distress.  Musculoskeletal:     Lumbar back: No bony tenderness. Normal range of motion. Negative right straight leg raise test and negative left straight leg raise test.  Skin:    General: Skin is warm and dry.  Neurological:     Mental Status: She is alert and oriented to person, place, and time.     Motor: No weakness.     Deep Tendon Reflexes:     Reflex Scores:      Patellar reflexes are 1+ on the right side and 1+ on the left side.      Achilles reflexes are 1+ on the right side and 1+ on the left side. Psychiatric:        Mood and Affect: Mood normal.        Behavior: Behavior normal.      No results found for any visits on 10/02/23.    The 10-year ASCVD risk score (Arnett DK, et al., 2019) is: 4.7%    Assessment & Plan:   Strain of lumbar region, initial encounter -     Meloxicam ; Take 1 tablet (7.5 mg total) by mouth daily.  Dispense: 30 tablet; Refill: 1 -     Methocarbamol ; .  Take 1 at night before bed  Dispense: 30 tablet; Refill: 1 -     Ambulatory referral to Sports Medicine    Return if symptoms worsen or fail to improve.  Sports medicine referral.  Pillow between her legs at night.  Exercises were given.  Will start meloxicam  and Robaxin .    Elsie Sim Lent, MD

## 2023-10-06 ENCOUNTER — Ambulatory Visit: Payer: 59 | Admitting: Family Medicine

## 2023-10-06 NOTE — Progress Notes (Deleted)
CHIEF COMPLAINT: No chief complaint on file.  _____________________________________________________________ SUBJECTIVE  HPI  Pt is a 63 y.o. female here for evaluation of Strain of lumbar region  Onset Inciting event Primarily located Radiating Numbness/tingling Exacerbated by Therapies tried  Seen by PCP 10/02/23: Ongoing for 3 months, nonradiating, bilateral lower back, no specific injury, manage after recent right total knee replacement.  Pain worse at night, can feel painful spasms, resolves during the day.  Possible tingling toes of right foot, no bowel or urine continence -Prescribed meloxicam and Baxan, referred to sports medicine -No recent films for review  Medical history includes bone spur, ankle and foot joint pain, osteopenia vitamin D deficiency, HLD, Medications include oxycodone, Robaxin, meloxicam, gabapentin, aspirin  ------------------------------------------------------------------------------------------------------ Past Medical History:  Diagnosis Date   Allergy    seasonal allergies   Arthritis    bilateral knees   Bone spur    Right Foot   Cataract    not a surgical candidate at this time (10/09/2020)   Depression    on meds   Dermatophytosis of nail    Essential hypertension, benign    on meds   Hyperlipidemia    diet controlled-no meds   Menorrhagia    Obesity    Osteopenia    Other and unspecified hyperlipidemia    Pain in joint, ankle and foot    Unspecified vitamin D deficiency     Past Surgical History:  Procedure Laterality Date   APPENDECTOMY     COLONOSCOPY  2012   WNL-Dr.Peters   MENISCUS REPAIR Left 2019   TONSILLECTOMY        Outpatient Encounter Medications as of 10/06/2023  Medication Sig Note   acetaminophen (TYLENOL) 500 MG tablet Take 500 mg by mouth every 6 (six) hours as needed.    aspirin 81 MG chewable tablet Chew 81 mg by mouth 2 (two) times daily. (Patient not taking: Reported on 10/02/2023)     Cholecalciferol (VITAMIN D3 PO) Take by mouth daily. (Patient not taking: Reported on 10/02/2023)    estradiol (ESTRACE) 0.1 MG/GM vaginal cream daily as needed. (Patient not taking: Reported on 10/02/2023) 05/18/2021: prn   fluticasone (FLONASE) 50 MCG/ACT nasal spray Place 2 sprays into both nostrils daily.    gabapentin (NEURONTIN) 100 MG capsule Take 100 mg by mouth every 8 (eight) hours. (Patient not taking: Reported on 10/02/2023)    Homeopathic Products (ARNICA EX) Apply topically. (Patient not taking: Reported on 10/02/2023)    meloxicam (MOBIC) 7.5 MG tablet Take 1 tablet (7.5 mg total) by mouth daily.    methocarbamol (ROBAXIN) 500 MG tablet .  Take 1 at night before bed    Misc Natural Products (JOINT SUPPORT PO) Take by mouth. (Patient not taking: Reported on 10/02/2023)    oxyCODONE (OXY IR/ROXICODONE) 5 MG immediate release tablet Take 5 mg by mouth every 6 (six) hours as needed. (Patient not taking: Reported on 10/02/2023)    pravastatin (PRAVACHOL) 40 MG tablet TAKE 1 TABLET(40 MG) BY MOUTH DAILY (Patient not taking: Reported on 10/02/2023)    promethazine-dextromethorphan (PROMETHAZINE-DM) 6.25-15 MG/5ML syrup Take 5 mLs by mouth 4 (four) times daily as needed. (Patient not taking: Reported on 10/02/2023)    triamterene-hydrochlorothiazide (MAXZIDE) 75-50 MG tablet TAKE 1 TABLET BY MOUTH DAILY (Patient not taking: Reported on 10/02/2023)    No facility-administered encounter medications on file as of 10/06/2023.    ------------------------------------------------------------------------------------------------------  _____________________________________________________________ OBJECTIVE  PHYSICAL EXAM  There were no vitals filed for this visit. There is no height or  weight on file to calculate BMI.   reviewed  General: A+Ox3, no acute distress, well-nourished, appropriate affect CV: pulses 2+ regular, nondiaphoretic, no peripheral edema, cap refill <2sec Lungs: no audible  wheezing, non-labored breathing, bilateral chest rise/fall, nontachypneic Skin: warm, well-perfused, non-icteric, no susp lesions or rashes Neuro: CN II-XII intact bilaterally. Sensation intact, muscle tone wnl, no atrophy Psych: no signs of depression or anxiety MSK: ***      _____________________________________________________________ ASSESSMENT/PLAN There are no diagnoses linked to this encounter.  PT XR Summary: ***  Differential Diagnosis:  Treatment:   No follow-ups on file.  Electronically signed by: Burna Forts, MD 10/06/2023 12:25 AM

## 2023-10-14 ENCOUNTER — Other Ambulatory Visit: Payer: Self-pay

## 2023-10-14 ENCOUNTER — Ambulatory Visit
Admission: RE | Admit: 2023-10-14 | Discharge: 2023-10-14 | Disposition: A | Discharge status: Home / Self Care | Payer: 59 | Source: Ambulatory Visit | Attending: Family Medicine | Admitting: Family Medicine

## 2023-10-14 VITALS — BP 128/80 | HR 101 | Temp 98.1°F | Resp 18 | Ht 63.0 in | Wt 195.0 lb

## 2023-10-14 DIAGNOSIS — R52 Pain, unspecified: Secondary | ICD-10-CM

## 2023-10-14 DIAGNOSIS — J020 Streptococcal pharyngitis: Secondary | ICD-10-CM | POA: Diagnosis not present

## 2023-10-14 LAB — POC COVID19/FLU A&B COMBO
Covid Antigen, POC: NEGATIVE
Influenza A Antigen, POC: NEGATIVE
Influenza B Antigen, POC: NEGATIVE

## 2023-10-14 LAB — POCT RAPID STREP A (OFFICE): Rapid Strep A Screen: POSITIVE — AB

## 2023-10-14 MED ORDER — AMOXICILLIN 875 MG PO TABS: 875.0000 mg | ORAL_TABLET | Freq: Two times a day (BID) | ORAL | 0 refills | Status: AC

## 2023-10-14 NOTE — ED Provider Notes (Signed)
[facility]    CSN: [id number] Arrival date & time: 10/14/23  4098      History   Chief Complaint Chief Complaint  Patient presents with   Generalized Body Aches    HPI Emily Stephens is a 63 y.o. female.   The history is provided by the patient.  Feeling well since yesterday symptoms include body aches, fatigue, frontal headache, sore throat, ear pain.  Admits low-grade fever of 99 and nausea today. Denies chills, sweats swollen glands, chest pain, shortness of breath, vomiting, diarrhea, cough.  Has been around her 60-year-old granddaughter who has upper respiratory times.  No confirmed COVID strep or flu exposures.  She is retired.  No known drug allergies.  Lives with husband who has not been sick.  Past Medical History:  Diagnosis Date   Allergy    seasonal allergies   Arthritis    bilateral knees   Bone spur    Right Foot   Cataract    not a surgical candidate at this time (10/09/2020)   Depression    on meds   Dermatophytosis of nail    Essential hypertension, benign    on meds   Hyperlipidemia    diet controlled-no meds   Menorrhagia    Obesity    Osteopenia    Other and unspecified hyperlipidemia    Pain in joint, ankle and foot    Unspecified vitamin D deficiency     Patient Active Problem List   Diagnosis Date Noted   Strep pharyngitis 08/21/2023   Apnea 05/09/2023   Dyspnea on exertion 05/09/2023   Family history of heart disease 05/09/2023   Arthritis of knee, right 04/25/2023   Ecchymosis of forearm 01/17/2023   Hirsutism 01/17/2023   Acute non-recurrent sinusitis 08/23/2022   Dysfunction of right eustachian tube 08/23/2022   Morbid obesity (HCC) 07/28/2022   Renal insufficiency 07/28/2022   Snores 07/11/2022   Urinary tract infection with hematuria 12/08/2021   Scar 11/08/2021   Sebaceous cyst 11/08/2021   Other fatigue 11/08/2021   Hair loss 11/08/2021   Depression with anxiety 09/02/2021   Need for shingles vaccine  09/02/2021   Hepatitis B core antibody positive 05/18/2021   Elevated cholesterol 05/18/2021   Body mass index (BMI) 36.0-36.9, adult 02/25/2021   Elevated blood-pressure reading, without diagnosis of hypertension 02/25/2021   Hypertensive disorder 09/22/2020   Perennial allergic rhinitis with seasonal variation 06/09/2020   Primary osteoarthritis of left knee 02/27/2020   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   Biceps tendinitis 04/16/2019   Carpal tunnel syndrome of right wrist 04/16/2019   Pain in right hand 04/16/2019   Shoulder pain 04/16/2019   Acute medial meniscus tear of left knee 12/08/2017   Acute pain of left knee 12/08/2017   Gastroesophageal reflux disease without esophagitis 08/10/2014   Hip pain, bilateral 03/30/2014   Lumbago 03/30/2014   Heart burn 12/17/2013   Lower back pain 12/17/2013   Sciatica of left side 12/17/2013   Excessive bleeding in premenopausal period 05/26/2013   Flushing 05/26/2013   Mixed hyperlipidemia 03/20/2003    Past Surgical History:  Procedure Laterality Date   APPENDECTOMY     COLONOSCOPY  2012   WNL-Dr.Peters   MENISCUS REPAIR Left 2019   TONSILLECTOMY      OB History   No obstetric history on file.      Home Medications    Prior to Admission medications   Medication Sig Start Date End Date Taking? Authorizing Provider  acetaminophen (TYLENOL) 500  MG tablet Take 500 mg by mouth every 6 (six) hours as needed.    [provider]  aspirin 81 MG chewable tablet Chew 81 mg by mouth 2 (two) times daily. Patient not taking: Reported on 10/02/2023 06/21/23   [provider]  Cholecalciferol (VITAMIN D3 PO) Take by mouth daily. Patient not taking: Reported on 10/02/2023    [provider]  estradiol (ESTRACE) 0.1 MG/GM vaginal cream daily as needed. Patient not taking: Reported on 10/02/2023 07/16/20   [provider]  fluticasone (FLONASE) 50 MCG/ACT nasal spray Place 2 sprays into both nostrils daily.  08/21/23   Garnette Gunner, MD  gabapentin (NEURONTIN) 100 MG capsule Take 100 mg by mouth every 8 (eight) hours. Patient not taking: Reported on 10/02/2023 07/06/23   [provider]  Homeopathic Products (ARNICA EX) Apply topically. Patient not taking: Reported on 10/02/2023    [provider]  meloxicam (MOBIC) 7.5 MG tablet Take 1 tablet (7.5 mg total) by mouth daily. 10/02/23   Mliss Sax, MD  methocarbamol (ROBAXIN) 500 MG tablet .  Take 1 at night before bed 10/02/23   Mliss Sax, MD  Misc Natural Products (JOINT SUPPORT PO) Take by mouth. Patient not taking: Reported on 10/02/2023    [provider]  oxyCODONE (OXY IR/ROXICODONE) 5 MG immediate release tablet Take 5 mg by mouth every 6 (six) hours as needed. Patient not taking: Reported on 10/02/2023 07/07/23   [provider]  pravastatin (PRAVACHOL) 40 MG tablet TAKE 1 TABLET(40 MG) BY MOUTH DAILY Patient not taking: Reported on 10/02/2023 09/23/22   Mliss Sax, MD  promethazine-dextromethorphan (PROMETHAZINE-DM) 6.25-15 MG/5ML syrup Take 5 mLs by mouth 4 (four) times daily as needed. Patient not taking: Reported on 10/02/2023 08/21/23   Garnette Gunner, MD  triamterene-hydrochlorothiazide [facility]) 75-50 MG tablet TAKE 1 TABLET BY MOUTH DAILY Patient not taking: Reported on 10/02/2023 09/18/23   Mliss Sax, MD    Family History Family History  Problem Relation Age of Onset   Lung disease Mother    Osteoporosis Mother    Colon polyps Mother 3   CVA Father    Heart disease Brother    Diabetes Maternal Aunt    Breast cancer Maternal Aunt    Diabetes Maternal Uncle    Aneurysm Sister    Breast cancer Cousin    Esophageal cancer Neg Hx    Colon cancer Neg Hx    Stomach cancer Neg Hx    Rectal cancer Neg Hx     Social History Social History   Tobacco Use   Smoking status: Never   Smokeless tobacco: Never  Vaping Use   Vaping status: Never  Used  Substance Use Topics   Alcohol use: Yes    Alcohol/week: 3.0 standard drinks of alcohol    Types: 3 Standard drinks or equivalent per week    Comment: occ   Drug use: No     Allergies   Patient has no known allergies.   Review of Systems Review of Systems  Constitutional:  Positive for appetite change, fatigue and fever. Negative for chills and diaphoresis.  HENT:  Positive for congestion, ear pain, rhinorrhea and sore throat. Negative for postnasal drip and trouble swallowing.   Respiratory:  Negative for cough and shortness of breath.   Cardiovascular:  Negative for chest pain.  Gastrointestinal:  Positive for nausea. Negative for abdominal pain, diarrhea and vomiting.  Skin:  Negative for rash.  Neurological:  Positive  for headaches.     Physical Exam Triage Vital Signs ED Triage Vitals  Encounter Vitals Group     BP 10/14/23 1002 128/80     Systolic BP Percentile --      Diastolic BP Percentile --      Pulse Rate 10/14/23 1002 (!) 101     Resp 10/14/23 1002 18     Temp 10/14/23 1002 98.1 F (36.7 C)     Temp Source 10/14/23 1002 Oral     SpO2 10/14/23 1002 96 %     Weight 10/14/23 1001 195 lb (88.5 kg)     Height 10/14/23 1001 5\' 3"  (1.6 m)     Head Circumference --      Peak Flow --      Pain Score 10/14/23 1001 5     Pain Loc --      Pain Education --      Exclude from Growth Chart --    No data found.  Updated Vital Signs BP 128/80 (BP Location: Right Arm)   Pulse (!) 101   Temp 98.1 F (36.7 C) (Oral)   Resp 18   Ht 5\' 3"  (1.6 m)   Wt 195 lb (88.5 kg)   LMP 10/28/2013   SpO2 96%   BMI 34.54 kg/m   Visual Acuity Right Eye Distance:   Left Eye Distance:   Bilateral Distance:    Right Eye Near:   Left Eye Near:    Bilateral Near:     Physical Exam Vitals and nursing note reviewed.  Constitutional:      Appearance: She is not ill-appearing.  HENT:     Head: Normocephalic.     Right Ear: Tympanic membrane and ear canal normal.      Left Ear: Tympanic membrane and ear canal normal.     Nose: No rhinorrhea.     Mouth/Throat:     Mouth: Mucous membranes are moist.     Pharynx: Oropharynx is clear. No oropharyngeal exudate or posterior oropharyngeal erythema.     Tonsils: No tonsillar exudate or tonsillar abscesses.  Eyes:     Conjunctiva/sclera: Conjunctivae normal.  Cardiovascular:     Rate and Rhythm: Normal rate and regular rhythm.     Heart sounds: Normal heart sounds.  Pulmonary:     Effort: Pulmonary effort is normal. No respiratory distress.     Breath sounds: No wheezing, rhonchi or rales.  Musculoskeletal:     Cervical back: Neck supple.  Skin:    General: Skin is warm.  Neurological:     Mental Status: She is alert and oriented to person, place, and time.  Psychiatric:        Mood and Affect: Mood normal.      UC Treatments / Results  Labs (all labs ordered are listed, but only abnormal results are displayed) Labs Reviewed  POC COVID19/FLU A&B COMBO  POCT RAPID STREP A (OFFICE)    EKG   Radiology No results found.  Procedures Procedures (including critical care time)  Medications Ordered in UC Medications - No data to display  Initial Impression / Assessment and Plan / UC Course  I have reviewed the triage vital signs and the nursing notes.  Pertinent labs & imaging results that were available during my care of the patient were reviewed by me and considered in my medical decision making (see chart for details).     63 year old female with abrupt onset of illness yesterday symptoms include headache, sore throat, subjective fever,  body aches, fatigue.  Has mild nasal congestion.  Mildly tachycardic at 101 otherwise vital signs are stable she is nontoxic-appearing, normal swallowing, clear voice.  Point-of-care strep is positive.  Point-of-care COVID and flu are Negative.  Rx antibiotics sent to pharmacy, recommend OTC NSAIDs salt water gargles throat lozenges for symptomatic relief.   Warning signs and follow-up reviewed with patient Final Clinical Impressions(s) / UC Diagnoses   Final diagnoses:  None   Discharge Instructions   None    ED Prescriptions   None    PDMP not reviewed this encounter.   Meliton Rattan, [place] 10/14/23 1028

## 2023-10-14 NOTE — Discharge Instructions (Addendum)
Supportive Care Medications  These are medications that may help with your symptoms. However, Please note that if your PCP has told you not to use certain products please follow his/her recommendations.    Fever, Body aches, Headache  Ibuprofen 200 mg 2 tablets and Acetaminophen 2 tabs 4 times a day just before meals and at bedtime (every 6 hours)    Do not use NSAIDS if you are allergic to NSAIDS, if you have been given oral steroids-prednisone, methylprednisolone, dexamethasone until your steroids are finished, if you are pregnant or breast feeding, or if you have history of kidney or liver disease.   Sore Throat: Cepacol throat lozenges or spray  Cough Drops Warm salt water gargles  How to make saltwater rinses Use warm water, because warmth is more relieving to a sore throat than cold water. Warm water will also help the salt dissolve into the water more effectively. Use any type of salt you have available. Most saltwater rinse recipes call for 8 ounces of warm water and 1 teaspoon of salt. However, if your mouth is tender and the saltwater rinse stings, decrease the salt to a 1/2 teaspoon for the first 1 to 2 days. Bring water to a boil, then remove from heat, add salt, and stir. Let the saltwater cool to a warm temperature before rinsing with it. Once you have finished your rinse, discard leftover solution to avoid contamination.  Nasal Congestion: Oral decongestants like sudafed but only if you do not have high Blood Pressure if you have high Blood Pressure take CORICIDIN HBP Antihistamines Nasal Saline/Neti Pot  Cough: Expectorants (guaifenesin) help thin mucus making it easier to get up. This should be used during the day. During the day coughing helps bring mucus up out of your lungs Suppressants (dextromethorphan) just for bedtime so you can sleep   Oral rehydration is important when you have been sweating more than usual and/or having nausea and vomiting. You can use over  the counter rehydration supplements like Pedialyte sport, Gatorlyte, Electrolit, Liquid IV, or you can make your own at home. Recipe:  Mix 1 liter of clean or boiled water with 6 teaspoons (2 tablespoons) of sugar and 1/2 tsp of salt. Stir until both dissolve.  You can add sugar free flavoring (i.e. Crystal light) if desired.  Drink small sips frequently rather than large amounts at once to prevent nausea.

## 2023-10-14 NOTE — ED Triage Notes (Signed)
Pt presents with complaints of generalized body aches, headache, throat pain, and bilateral ear pain that began yesterday afternoon, 1/24. Pt currently rates her overall pain a 5/10. Mucinex is the only medication taken 1 hour PTA. Temperature at home was 99 F. Pt requesting to be tested for COVID, Flu, and strep.

## 2023-10-24 NOTE — Telephone Encounter (Signed)
 Copied from CRM [id number]. Topic: Clinical - Request for Lab/Test Order >> Oct 24, 2023  9:43 AM Russell PARAS wrote: Reason for CRM: Patient is requesting if lab orders could be put in prior to her appointment on 02/06 so that she can discuss results with him. Requested CB# [phone], so if possible she can schedule a lab visit.  Forwarding message above.

## 2023-10-26 ENCOUNTER — Ambulatory Visit: Payer: 59 | Admitting: Family Medicine

## 2023-10-26 VITALS — BP 138/82 | HR 72 | Temp 97.6°F | Ht 63.0 in | Wt 198.6 lb

## 2023-10-26 DIAGNOSIS — E559 Vitamin D deficiency, unspecified: Secondary | ICD-10-CM

## 2023-10-26 DIAGNOSIS — Z131 Encounter for screening for diabetes mellitus: Secondary | ICD-10-CM | POA: Insufficient documentation

## 2023-10-26 DIAGNOSIS — E7800 Pure hypercholesterolemia, unspecified: Secondary | ICD-10-CM

## 2023-10-26 DIAGNOSIS — I10 Essential (primary) hypertension: Secondary | ICD-10-CM

## 2023-10-26 MED ORDER — LISINOPRIL 20 MG PO TABS: 20.0000 mg | ORAL_TABLET | Freq: Every day | ORAL | 0 refills | Status: DC

## 2023-10-26 NOTE — Progress Notes (Addendum)
 Here for okay  Established Patient Office Visit   Subjective:  Patient ID: Emily Stephens, female    DOB: 20-May-1961  Age: 63 y.o. MRN: [id number]  Chief Complaint  Patient presents with   Medical Management of Chronic Issues    6 month follow up.     HPI Encounter Diagnoses  Name Primary?   Essential hypertension Yes   Screening for diabetes mellitus    Elevated cholesterol    Vitamin D  deficiency    For follow-up of above.  Status post DC of pravastatin  secondary to a 0 calcium  score of her coronary arteries.  She is exercising some.   Review of Systems  Constitutional: Negative.   HENT: Negative.    Eyes:  Negative for blurred vision, discharge and redness.  Respiratory: Negative.    Cardiovascular: Negative.   Gastrointestinal:  Negative for abdominal pain.  Genitourinary: Negative.   Musculoskeletal: Negative.  Negative for myalgias.  Skin:  Negative for rash.  Neurological:  Negative for tingling, loss of consciousness and weakness.  Endo/Heme/Allergies:  Negative for polydipsia.     Current Outpatient Medications:    lisinopril  (ZESTRIL ) 20 MG tablet, Take 1 tablet (20 mg total) by mouth daily., Disp: 90 tablet, Rfl: 0   predniSONE  (STERAPRED UNI-PAK 21 TAB) 10 MG (21) TBPK tablet, Take 10 mg by mouth as directed., Disp: , Rfl:    Vitamin D , Ergocalciferol , (DRISDOL ) 1.25 MG (50000 UNIT) CAPS capsule, Take 1 capsule (50,000 Units total) by mouth every 7 (seven) days., Disp: 12 capsule, Rfl: 3   estradiol (ESTRACE) 0.1 MG/GM vaginal cream, daily as needed. (Patient not taking: Reported on 10/26/2023), Disp: , Rfl:    Objective:     BP 138/82   Pulse 72   Temp 97.6 F (36.4 C)   Ht 5' 3 (1.6 m)   Wt 198 lb 9.6 oz (90.1 kg)   LMP 10/28/2013   SpO2 96%   BMI 35.18 kg/m  BP Readings from Last 3 Encounters:  10/26/23 138/82  10/14/23 128/80  10/02/23 122/76   Wt Readings from Last 3 Encounters:  10/26/23 198 lb 9.6 oz (90.1 kg)  10/14/23 195 lb (88.5  kg)  10/02/23 199 lb 9.6 oz (90.5 kg)      Physical Exam Constitutional:      General: She is not in acute distress.    Appearance: Normal appearance. She is not ill-appearing, toxic-appearing or diaphoretic.  HENT:     Head: Normocephalic and atraumatic.     Right Ear: External ear normal.     Left Ear: External ear normal.     Mouth/Throat:     Mouth: Mucous membranes are moist.     Pharynx: Oropharynx is clear. No oropharyngeal exudate or posterior oropharyngeal erythema.  Eyes:     General: No scleral icterus.       Right eye: No discharge.        Left eye: No discharge.     Extraocular Movements: Extraocular movements intact.     Conjunctiva/sclera: Conjunctivae normal.     Pupils: Pupils are equal, round, and reactive to light.  Cardiovascular:     Rate and Rhythm: Normal rate and regular rhythm.  Pulmonary:     Effort: Pulmonary effort is normal. No respiratory distress.     Breath sounds: Normal breath sounds.  Musculoskeletal:     Cervical back: No rigidity or tenderness.  Skin:    General: Skin is warm and dry.  Neurological:     Mental Status: She  is alert and oriented to person, place, and time.  Psychiatric:        Mood and Affect: Mood normal.        Behavior: Behavior normal.      No results found for any visits on 10/26/23.  He should she will come back for blood work  The 10-year ASCVD risk score (Arnett DK, et al., 2019) is: 5.8%    Assessment & Plan:   Essential hypertension -     Lisinopril ; Take 1 tablet (20 mg total) by mouth daily.  Dispense: 90 tablet; Refill: 0 -     Basic metabolic panel; Future  Screening for diabetes mellitus -     Basic metabolic panel; Future -     Hemoglobin A1c; Future  Elevated cholesterol -     Lipid panel; Future  Vitamin D  deficiency -     VITAMIN D  25 Hydroxy (Vit-D Deficiency, Fractures); Future -     Vitamin D  (Ergocalciferol ); Take 1 capsule (50,000 Units total) by mouth every 7 (seven) days.   Dispense: 12 capsule; Refill: 3    Return in about 7 weeks (around 12/14/2023) for chronic disease follow-up.  Encouraged regular exercise for 30 minutes daily or 450 minutes weekly total.  Will start lisinopril .  Information was given on lisinopril  managing hypertension.  Continue weight loss efforts.  Elsie Sim Lent, MD

## 2023-10-27 ENCOUNTER — Other Ambulatory Visit: Payer: 59

## 2023-10-27 ENCOUNTER — Ambulatory Visit: Payer: 59

## 2023-10-30 ENCOUNTER — Other Ambulatory Visit (INDEPENDENT_AMBULATORY_CARE_PROVIDER_SITE_OTHER): Payer: 59

## 2023-10-30 DIAGNOSIS — I10 Essential (primary) hypertension: Secondary | ICD-10-CM

## 2023-10-30 DIAGNOSIS — E7800 Pure hypercholesterolemia, unspecified: Secondary | ICD-10-CM | POA: Diagnosis not present

## 2023-10-30 DIAGNOSIS — E559 Vitamin D deficiency, unspecified: Secondary | ICD-10-CM | POA: Diagnosis not present

## 2023-10-30 DIAGNOSIS — Z131 Encounter for screening for diabetes mellitus: Secondary | ICD-10-CM | POA: Diagnosis not present

## 2023-10-30 LAB — LIPID PANEL
Cholesterol: 175 mg/dL (ref 0–200)
HDL: 68.3 mg/dL (ref >39.00)
LDL Cholesterol: 77 mg/dL (ref 0–99)
NonHDL: 106.4
Total CHOL/HDL Ratio: 3
Triglycerides: 146 mg/dL (ref 0.0–149.0)
VLDL: 29.2 mg/dL (ref 0.0–40.0)

## 2023-10-30 LAB — BASIC METABOLIC PANEL
BUN: 21 mg/dL (ref 6–23)
CO2: 25 meq/L (ref 19–32)
Calcium: 9.2 mg/dL (ref 8.4–10.5)
Chloride: 104 meq/L (ref 96–112)
Creatinine, Ser: 0.76 mg/dL (ref 0.40–1.20)
GFR: 83.61 mL/min (ref >60.00)
Glucose, Bld: 82 mg/dL (ref 70–99)
Potassium: 4.5 meq/L (ref 3.5–5.1)
Sodium: 139 meq/L (ref 135–145)

## 2023-10-30 LAB — HEMOGLOBIN A1C: Hgb A1c MFr Bld: 5.6 % (ref 4.6–6.5)

## 2023-10-30 LAB — VITAMIN D 25 HYDROXY (VIT D DEFICIENCY, FRACTURES): VITD: 22.14 ng/mL — ABNORMAL LOW (ref 30.00–100.00)

## 2023-10-31 ENCOUNTER — Encounter: Payer: Self-pay | Admitting: Family Medicine

## 2023-10-31 MED ORDER — VITAMIN D (ERGOCALCIFEROL) 1.25 MG (50000 UNIT) PO CAPS: 50000.0000 [IU] | ORAL_CAPSULE | ORAL | 3 refills | Status: DC

## 2023-10-31 NOTE — Addendum Note (Signed)
Addended by: Andrez Grime on: 10/31/2023 07:46 AM   Modules accepted: Orders

## 2023-11-08 ENCOUNTER — Ambulatory Visit: Payer: BC Managed Care – PPO | Admitting: Cardiovascular Disease

## 2023-12-03 ENCOUNTER — Other Ambulatory Visit: Payer: Self-pay | Admitting: Family Medicine

## 2023-12-03 DIAGNOSIS — E7800 Pure hypercholesterolemia, unspecified: Secondary | ICD-10-CM

## 2023-12-08 ENCOUNTER — Ambulatory Visit: Admitting: Family Medicine

## 2023-12-11 ENCOUNTER — Ambulatory Visit: Admitting: Family Medicine

## 2024-01-25 ENCOUNTER — Other Ambulatory Visit: Payer: Self-pay | Admitting: Family Medicine

## 2024-01-25 DIAGNOSIS — I10 Essential (primary) hypertension: Secondary | ICD-10-CM

## 2024-02-05 ENCOUNTER — Encounter: Payer: Self-pay | Admitting: Obstetrics and Gynecology

## 2024-02-14 ENCOUNTER — Other Ambulatory Visit: Payer: Self-pay | Admitting: Family Medicine

## 2024-02-14 DIAGNOSIS — S39012A Strain of muscle, fascia and tendon of lower back, initial encounter: Secondary | ICD-10-CM

## 2024-02-24 ENCOUNTER — Other Ambulatory Visit: Payer: Self-pay | Admitting: Family Medicine

## 2024-02-24 DIAGNOSIS — I10 Essential (primary) hypertension: Secondary | ICD-10-CM

## 2024-04-05 ENCOUNTER — Encounter: Payer: Self-pay | Admitting: Advanced Practice Midwife

## 2024-05-17 ENCOUNTER — Encounter: Payer: Self-pay | Admitting: Orthopedic Surgery

## 2024-05-21 ENCOUNTER — Other Ambulatory Visit: Payer: Self-pay | Admitting: Orthopedic Surgery

## 2024-05-21 DIAGNOSIS — M5451 Vertebrogenic low back pain: Secondary | ICD-10-CM

## 2024-05-21 DIAGNOSIS — M545 Low back pain, unspecified: Secondary | ICD-10-CM

## 2024-06-01 ENCOUNTER — Inpatient Hospital Stay
Admission: RE | Admit: 2024-06-01 | Discharge: 2024-06-01 | Disposition: A | Discharge status: Home / Self Care | Source: Ambulatory Visit | Attending: Orthopedic Surgery | Admitting: Orthopedic Surgery

## 2024-06-01 DIAGNOSIS — M545 Low back pain, unspecified: Secondary | ICD-10-CM

## 2024-06-01 DIAGNOSIS — M5451 Vertebrogenic low back pain: Secondary | ICD-10-CM

## 2024-06-20 ENCOUNTER — Ambulatory Visit: Admitting: Family Medicine

## 2024-06-20 ENCOUNTER — Encounter: Payer: Self-pay | Admitting: Family Medicine

## 2024-06-20 VITALS — BP 126/84 | HR 82 | Temp 96.8°F | Ht 63.0 in | Wt 216.4 lb

## 2024-06-20 DIAGNOSIS — I10 Essential (primary) hypertension: Secondary | ICD-10-CM

## 2024-06-20 DIAGNOSIS — M545 Low back pain, unspecified: Secondary | ICD-10-CM | POA: Diagnosis not present

## 2024-06-20 DIAGNOSIS — R0683 Snoring: Secondary | ICD-10-CM

## 2024-06-20 DIAGNOSIS — E559 Vitamin D deficiency, unspecified: Secondary | ICD-10-CM | POA: Diagnosis not present

## 2024-06-20 DIAGNOSIS — Z23 Encounter for immunization: Secondary | ICD-10-CM

## 2024-06-20 DIAGNOSIS — Z1211 Encounter for screening for malignant neoplasm of colon: Secondary | ICD-10-CM

## 2024-06-20 DIAGNOSIS — G8929 Other chronic pain: Secondary | ICD-10-CM

## 2024-06-20 MED ORDER — TIZANIDINE HCL 4 MG PO CAPS: 4.0000 mg | ORAL_CAPSULE | Freq: Every evening | ORAL | 0 refills | Status: DC

## 2024-06-20 NOTE — Progress Notes (Signed)
 Established Patient Office Visit   Subjective:  Patient ID: Emily Stephens, female    DOB: 1961/07/20  Age: 63 y.o. MRN: [id number]  Chief Complaint  Patient presents with   Back Pain    Pt states back pain has been treated to PT, and medications. PT went to Surgeon yesterday and did not feel he had time to listen, was prescribed meloxicam . Still have stiffness and pain at night mostly.    Medical Management of Chronic Issues    Follow up. Pt wants Vitamin D  checked.     Back Pain Pertinent negatives include no abdominal pain, tingling or weakness.   Encounter Diagnoses  Name Primary?   Chronic bilateral low back pain, unspecified whether sciatica present Yes   Screening for colon cancer    Immunization due    Essential hypertension    Snores    Vitamin D  deficiency    Ongoing lower back pain.  Seems to be worse in the evening with stiffness.  Recent consultation with orthopedic surgeon who recommended meloxicam .  Surgeon did not feel as though surgery was indicated for her.  Recent MRI did reveal disc protrusions at L4-5 and L5-S1 not overtly affecting the nerve roots.  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  Blood pressure controlled with lisinopril  20.  Continues high-dose weekly vitamin D  tablets.   Review of Systems  Constitutional: Negative.   HENT: Negative.    Eyes:  Negative for blurred vision, discharge and redness.  Respiratory: Negative.    Cardiovascular: Negative.   Gastrointestinal:  Negative for abdominal pain.  Genitourinary: Negative.   Musculoskeletal:  Positive for back pain. Negative for myalgias.  Skin:  Negative for rash.  Neurological:  Negative for tingling, loss of consciousness and weakness.  Endo/Heme/Allergies:  Negative for polydipsia.     Current Outpatient Medications:    estradiol (ESTRACE) 0.1 MG/GM vaginal cream, daily as needed., Disp: , Rfl:    lisinopril  (ZESTRIL ) 20 MG tablet, TAKE 1 TABLET(20 MG) BY MOUTH DAILY, Disp: 30  tablet, Rfl: 0   meloxicam  (MOBIC ) 15 MG tablet, Take 15 mg by mouth daily., Disp: , Rfl:    methocarbamol  (ROBAXIN ) 500 MG tablet, Take 500 mg by mouth every 6 (six) hours as needed., Disp: , Rfl:    tiZANidine (ZANAFLEX) 4 MG capsule, Take 1 capsule (4 mg total) by mouth at bedtime., Disp: 90 capsule, Rfl: 0   Vitamin D , Ergocalciferol , (DRISDOL ) 1.25 MG (50000 UNIT) CAPS capsule, Take 1 capsule (50,000 Units total) by mouth every 7 (seven) days., Disp: 12 capsule, Rfl: 3   predniSONE  (STERAPRED UNI-PAK 21 TAB) 10 MG (21) TBPK tablet, Take 10 mg by mouth as directed. (Patient not taking: Reported on 06/20/2024), Disp: , Rfl:    Objective:     BP 126/84 (BP Location: Right Arm, Patient Position: Sitting, Cuff Size: Normal)   Pulse 82   Temp (!) 96.8 F (36 C) (Temporal)   Ht 5' 3 (1.6 m)   Wt 216 lb 6.4 oz (98.2 kg)   LMP 10/28/2013   SpO2 97%   BMI 38.33 kg/m    Physical Exam Constitutional:      General: She is not in acute distress.    Appearance: Normal appearance. She is not ill-appearing, toxic-appearing or diaphoretic.  HENT:     Head: Normocephalic and atraumatic.     Right Ear: External ear normal.     Left Ear: External ear normal.  Eyes:     General: No scleral icterus.  Right eye: No discharge.        Left eye: No discharge.     Extraocular Movements: Extraocular movements intact.     Conjunctiva/sclera: Conjunctivae normal.  Pulmonary:     Effort: Pulmonary effort is normal. No respiratory distress.  Skin:    General: Skin is warm and dry.  Neurological:     Mental Status: She is alert and oriented to person, place, and time.  Psychiatric:        Mood and Affect: Mood normal.        Behavior: Behavior normal.      No results found for any visits on 06/20/24.    The 10-year ASCVD risk score (Arnett DK, et al., 2019) is: 4.8%    Assessment & Plan:   Chronic bilateral low back pain, unspecified whether sciatica present -     Ambulatory  referral to Pain Clinic -     tiZANidine HCl; Take 1 capsule (4 mg total) by mouth at bedtime.  Dispense: 90 capsule; Refill: 0  Screening for colon cancer -     Ambulatory referral to Gastroenterology  Immunization due -     Pneumococcal conjugate vaccine 20-valent -     Flu vaccine trivalent PF, 6mos and older(Flulaval,Afluria,Fluarix,Fluzone)  Essential hypertension -     Basic metabolic panel with GFR  Snores -     Pulmonary Visit  Vitamin D  deficiency -     VITAMIN D  25 Hydroxy (Vit-D Deficiency, Fractures)    Return in about 3 months (around 09/20/2024).  Explained that meloxicam  is a reasonable choice for her pain.  Will try the tizanidine at night.  She will place a pillow between her legs if she sleeps on her side and under her legs if she sleeps on her back.  Physical medicine consultation.  Pulmonary consultation for overdue sleep study.  Elsie Sim Lent, MD

## 2024-06-21 LAB — BASIC METABOLIC PANEL WITH GFR
BUN: 21 mg/dL (ref 6–23)
CO2: 26 meq/L (ref 19–32)
Calcium: 9.7 mg/dL (ref 8.4–10.5)
Chloride: 102 meq/L (ref 96–112)
Creatinine, Ser: 0.9 mg/dL (ref 0.40–1.20)
GFR: 67.95 mL/min (ref >60.00)
Glucose, Bld: 90 mg/dL (ref 70–99)
Potassium: 4.4 meq/L (ref 3.5–5.1)
Sodium: 138 meq/L (ref 135–145)

## 2024-06-21 LAB — VITAMIN D 25 HYDROXY (VIT D DEFICIENCY, FRACTURES): VITD: 47.27 ng/mL (ref 30.00–100.00)

## 2024-06-24 ENCOUNTER — Ambulatory Visit: Payer: Self-pay | Admitting: Family Medicine

## 2024-07-03 ENCOUNTER — Ambulatory Visit (INDEPENDENT_AMBULATORY_CARE_PROVIDER_SITE_OTHER): Admitting: Nurse Practitioner

## 2024-07-03 ENCOUNTER — Encounter: Payer: Self-pay | Admitting: Nurse Practitioner

## 2024-07-03 VITALS — BP 137/87 | HR 71 | Temp 98.3°F | Resp 18 | Ht 63.0 in | Wt 217.0 lb

## 2024-07-03 DIAGNOSIS — E66812 Obesity, class 2: Secondary | ICD-10-CM

## 2024-07-03 DIAGNOSIS — Z6838 Body mass index (BMI) 38.0-38.9, adult: Secondary | ICD-10-CM

## 2024-07-03 DIAGNOSIS — R0683 Snoring: Secondary | ICD-10-CM | POA: Diagnosis not present

## 2024-07-03 DIAGNOSIS — G4719 Other hypersomnia: Secondary | ICD-10-CM | POA: Diagnosis not present

## 2024-07-03 NOTE — Assessment & Plan Note (Signed)
 BMI 38. If she does have moderate to severe OSA, would be an appropriate candidate for GLP 1 therapy with Zepbound . Will readdress at follow up and initiate therapy, if indicated

## 2024-07-03 NOTE — Progress Notes (Signed)
 @Patient  ID: Emily Stephens, female    DOB: Dec 30, 1960, 63 y.o.   MRN: [id number]  Chief Complaint  Patient presents with   Consult    Sleep    Referring provider: Berneta Elsie Sim DEWAINE  HPI: 63 year old female, never smoker referred for sleep consult. Past medical history significant for HTN, allergic rhinitis, GERD, arthritis, HLD, obesity, depression, anxiety.   TEST/EVENTS:   07/03/2024: Today - sleep consult Discussed the use of AI scribe software for clinical note transcription with the patient, who gave verbal consent to proceed.  History of Present Illness Emily Stephens is a 63 year old female who presents with sleep disturbances and snoring. She was referred by her primary care doctor for a sleep study due to snoring and feeling unrested.  She experiences feeling unrested upon waking and occasional morning headaches. Her husband, who is usually asleep, does not notice the snoring. No episodes of apnea, drowsy driving, or falling asleep while driving. She describes possible episodes of sleep paralysis, where she wakes up unable to move or talk. These are infrequent occurrences. She wakes up two to three times a night but falls asleep easily. She takes a muscle relaxer and Tylenol  PM for sleep, which she started after having two knee replacements last year. She does sometimes wake up gasping or coughing.   She experiences back pain that wakes her up at night and is stiff in the morning. She reports that an MRI, as explained by her doctor, showed arthritis and a small bone spur. There is improvement in back pain but discomfort persists when lying down.  She has gained weight due to inactivity following knee surgeries and acknowledges the need to lose weight. Her caffeine intake is limited to diet soda and occasional coffee, and she consumes one to two alcoholic drinks per week.  She is retired. Lives with her husband. She goes to bed between 10-11 pm. Falls asleep in 15 min.  Wakes 2-3 times a night. Gets up around 7 am. Never had a prior sleep study. No CPAP or oxygen use. Weight is up 15 lb in last 2 years  Epworth 13    No Known Allergies  Immunization History  Administered Date(s) Administered   Influenza Split 05/20/2002   Influenza, Seasonal, Injecte, Preservative Fre 06/20/2024   Influenza,inj,Quad PF,6+ Mos 10/18/2016, 07/18/2018, 07/20/2021, 07/11/2022   Influenza-Unspecified 05/20/2002, 10/18/2016, 05/23/2019, 06/29/2022   PFIZER Comirnaty(Gray Top)Covid-19 Tri-Sucrose Vaccine 02/17/2021   PFIZER(Purple Top)SARS-COV-2 Vaccination 11/16/2019, 12/07/2019, 06/19/2020   PNEUMOCOCCAL CONJUGATE-20 06/20/2024   Td 09/19/1997, 09/19/1997   Td (Adult),5 Lf Tetanus Toxid, Preservative Free 09/19/1997   Tdap 09/19/1997, 01/01/2008, 08/06/2019   Unspecified SARS-COV-2 Vaccination 06/13/2022   Zoster Recombinant(Shingrix) 05/18/2021, 09/02/2021   Zoster, Live 06/21/2012, 12/26/2021    Past Medical History:  Diagnosis Date   Allergy    seasonal allergies   Arthritis    bilateral knees   Bone spur    Right Foot   Cataract    not a surgical candidate at this time (10/09/2020)   Depression    on meds   Dermatophytosis of nail    Essential hypertension, benign    on meds   Hyperlipidemia    diet controlled-no meds   Menorrhagia    Obesity    Osteopenia    Other and unspecified hyperlipidemia    Pain in joint, ankle and foot    Unspecified vitamin D  deficiency     Tobacco History: Social History   Tobacco Use  Smoking Status Never  Smokeless Tobacco Never   Counseling given: Not Answered   Outpatient Medications Prior to Visit  Medication Sig Dispense Refill   estradiol (ESTRACE) 0.1 MG/GM vaginal cream daily as needed.     lisinopril  (ZESTRIL ) 20 MG tablet TAKE 1 TABLET(20 MG) BY MOUTH DAILY 30 tablet 0   meloxicam  (MOBIC ) 15 MG tablet Take 15 mg by mouth daily.     methocarbamol  (ROBAXIN ) 500 MG tablet Take 500 mg by mouth every 6  (six) hours as needed.     Vitamin D , Ergocalciferol , (DRISDOL ) 1.25 MG (50000 UNIT) CAPS capsule Take 1 capsule (50,000 Units total) by mouth every 7 (seven) days. 12 capsule 3   predniSONE  (STERAPRED UNI-PAK 21 TAB) 10 MG (21) TBPK tablet Take 10 mg by mouth as directed. (Patient not taking: Reported on 06/20/2024)     tiZANidine (ZANAFLEX) 4 MG capsule Take 1 capsule (4 mg total) by mouth at bedtime. (Patient not taking: Reported on 07/03/2024) 90 capsule 0   No facility-administered medications prior to visit.     Review of Systems: as above    Physical Exam:  BP 137/87 (BP Location: Right Arm, Patient Position: Sitting, Cuff Size: Normal)   Pulse 71   Temp 98.3 F (36.8 C) (Oral)   Resp 18   Ht 5' 3 (1.6 m)   Wt 217 lb (98.4 kg)   LMP 10/28/2013   SpO2 95%   BMI 38.44 kg/m   GEN: Pleasant, interactive, well-appearing; obese; in no acute distress HEENT:  Normocephalic and atraumatic. PERRLA. Sclera white. Nasal turbinates pink, moist and patent bilaterally. No rhinorrhea present. Oropharynx pink and moist, without exudate or edema. No lesions, ulcerations, or postnasal drip.  NECK:  Supple w/ fair ROM. No JVD present. Thyroid symmetrical with no goiter or nodules palpated. No lymphadenopathy.   CV: RRR, no m/r/g PULMONARY:  Unlabored, regular breathing. Clear bilaterally A&P w/o wheezes/rales/rhonchi.  GI: BS present and normoactive. Soft, non-tender to palpation.  MSK: No erythema, warmth or tenderness. Cap refil <2 sec all extrem.  Neuro: A/Ox3. No focal deficits noted.   Skin: Warm, no lesions or rashe Psych: Normal affect and behavior. Judgement and thought content appropriate.     Lab Results:  CBC    Component Value Date/Time   WBC 9.4 07/11/2022 1344   RBC 5.24 (H) 07/11/2022 1344   HGB 14.5 07/11/2022 1344   HCT 42.9 07/11/2022 1344   PLT 280.0 07/11/2022 1344   MCV 82.0 07/11/2022 1344   MCH 27.3 12/23/2013 0933   MCHC 33.8 07/11/2022 1344   RDW 13.5  07/11/2022 1344   LYMPHSABS 2.2 07/11/2022 1344   MONOABS 0.9 07/11/2022 1344   EOSABS 0.1 07/11/2022 1344   BASOSABS 0.1 07/11/2022 1344    BMET    Component Value Date/Time   NA 138 06/20/2024 1638   NA 140 12/09/2022 1418   K 4.4 06/20/2024 1638   CL 102 06/20/2024 1638   CO2 26 06/20/2024 1638   GLUCOSE 90 06/20/2024 1638   BUN 21 06/20/2024 1638   BUN 15 12/09/2022 1418   CREATININE 0.90 06/20/2024 1638   CREATININE 0.70 12/23/2013 0933   CALCIUM  9.7 06/20/2024 1638   GFRNONAA >89 12/23/2013 0933   GFRAA >89 12/23/2013 0933    BNP No results found for: BNP   Imaging:  No results found.  Administration History     None           No data to display  No results found for: NITRICOXIDE      Assessment & Plan:   Excessive daytime sleepiness She has snoring, excessive daytime sleepiness, restless sleep. BMI 38. History of HTN. Epworth 13. Given this,  I am concerned she could have sleep disordered breathing with obstructive sleep apnea. She will need sleep study for further evaluation.    - discussed how weight can impact sleep and risk for sleep disordered breathing - discussed options to assist with weight loss: combination of diet modification, cardiovascular and strength training exercises   - had an extensive discussion regarding the adverse health consequences related to untreated sleep disordered breathing - specifically discussed the risks for hypertension, coronary artery disease, cardiac dysrhythmias, cerebrovascular disease, and diabetes - lifestyle modification discussed   - discussed how sleep disruption can increase risk of accidents, particularly when driving - safe driving practices were discussed  Patient Instructions  Given your symptoms, I am concerned that you may have sleep disordered breathing with sleep apnea. You will need a sleep study for further evaluation. Someone will contact you to schedule this.   We  discussed how untreated sleep apnea puts an individual at risk for cardiac arrhthymias, pulm HTN, DM, stroke and increases their risk for daytime accidents. We also briefly reviewed treatment options including weight loss, side sleeping position, oral appliance, CPAP therapy or referral to ENT for possible surgical options  Use caution when driving and pull over if you become sleepy.  Follow up in 6 weeks with Katie Koralynn Greenspan,NP to go over sleep study results, or sooner, if needed. Friday PM virtual clinic preferred       Loud snoring See above  Class 2 obesity with body mass index (BMI) of 38.0 to 38.9 in adult BMI 38. If she does have moderate to severe OSA, would be an appropriate candidate for GLP 1 therapy with Zepbound . Will readdress at follow up and initiate therapy, if indicated    Advised if symptoms do not improve or worsen, to please contact office for sooner follow up or seek emergency care.   I spent 45 minutes of dedicated to the care of this patient on the date of this encounter to include pre-visit review of records, face-to-face time with the patient discussing conditions above, post visit ordering of testing, clinical documentation with the electronic health record, making appropriate referrals as documented, and communicating necessary findings to members of the patients care team.  Comer LULLA Rouleau, NP 07/03/2024  Pt aware and understands NP's role.

## 2024-07-03 NOTE — Patient Instructions (Signed)

## 2024-07-03 NOTE — Assessment & Plan Note (Addendum)
" >>  ASSESSMENT AND PLAN FOR OSA (OBSTRUCTIVE SLEEP APNEA) WRITTEN ON 07/03/2024  9:29 AM BY Levester Waldridge V, NP  See above   >>ASSESSMENT AND PLAN FOR EXCESSIVE DAYTIME SLEEPINESS WRITTEN ON 07/03/2024  9:29 AM BY Aneliese Beaudry V, NP  She has snoring, excessive daytime sleepiness, restless sleep. BMI 38. History of HTN. Epworth 13. Given this,  I am concerned she could have sleep disordered breathing with obstructive sleep apnea. She will need sleep study for further evaluation.    - discussed how weight can impact sleep and risk for sleep disordered breathing - discussed options to assist with weight loss: combination of diet modification, cardiovascular and strength training exercises   - had an extensive discussion regarding the adverse health consequences related to untreated sleep disordered breathing - specifically discussed the risks for hypertension, coronary artery disease, cardiac dysrhythmias, cerebrovascular disease, and diabetes - lifestyle modification discussed   - discussed how sleep disruption can increase risk of accidents, particularly when driving - safe driving practices were discussed  Patient Instructions  Given your symptoms, I am concerned that you may have sleep disordered breathing with sleep apnea. You will need a sleep study for further evaluation. Someone will contact you to schedule this.   We discussed how untreated sleep apnea puts an individual at risk for cardiac arrhthymias, pulm HTN, DM, stroke and increases their risk for daytime accidents. We also briefly reviewed treatment options including weight loss, side sleeping position, oral appliance, CPAP therapy or referral to ENT for possible surgical options  Use caution when driving and pull over if you become sleepy.  Follow up in 6 weeks with Katie Emilya Justen,NP to go over sleep study results, or sooner, if needed. Friday PM virtual clinic preferred      "

## 2024-07-03 NOTE — Assessment & Plan Note (Signed)
 She has snoring, excessive daytime sleepiness, restless sleep. BMI 38. History of HTN. Epworth 13. Given this,  I am concerned she could have sleep disordered breathing with obstructive sleep apnea. She will need sleep study for further evaluation.    - discussed how weight can impact sleep and risk for sleep disordered breathing - discussed options to assist with weight loss: combination of diet modification, cardiovascular and strength training exercises   - had an extensive discussion regarding the adverse health consequences related to untreated sleep disordered breathing - specifically discussed the risks for hypertension, coronary artery disease, cardiac dysrhythmias, cerebrovascular disease, and diabetes - lifestyle modification discussed   - discussed how sleep disruption can increase risk of accidents, particularly when driving - safe driving practices were discussed  Patient Instructions  Given your symptoms, I am concerned that you may have sleep disordered breathing with sleep apnea. You will need a sleep study for further evaluation. Someone will contact you to schedule this.   We discussed how untreated sleep apnea puts an individual at risk for cardiac arrhthymias, pulm HTN, DM, stroke and increases their risk for daytime accidents. We also briefly reviewed treatment options including weight loss, side sleeping position, oral appliance, CPAP therapy or referral to ENT for possible surgical options  Use caution when driving and pull over if you become sleepy.  Follow up in 6 weeks with Emily Taysen Bushart,NP to go over sleep study results, or sooner, if needed. Friday PM virtual clinic preferred

## 2024-07-22 ENCOUNTER — Ambulatory Visit
Admission: RE | Admit: 2024-07-22 | Discharge: 2024-07-22 | Disposition: A | Discharge status: Home / Self Care | Source: Ambulatory Visit | Attending: Nurse Practitioner | Admitting: Nurse Practitioner

## 2024-07-22 VITALS — BP 144/77 | HR 103 | Temp 98.2°F | Resp 17

## 2024-07-22 DIAGNOSIS — B9789 Other viral agents as the cause of diseases classified elsewhere: Secondary | ICD-10-CM | POA: Diagnosis not present

## 2024-07-22 DIAGNOSIS — J329 Chronic sinusitis, unspecified: Secondary | ICD-10-CM

## 2024-07-22 LAB — POC COVID19/FLU A&B COMBO
Covid Antigen, POC: NEGATIVE
Influenza A Antigen, POC: NEGATIVE
Influenza B Antigen, POC: NEGATIVE

## 2024-07-22 LAB — POCT RAPID STREP A (OFFICE): Rapid Strep A Screen: NEGATIVE

## 2024-07-22 MED ORDER — CETIRIZINE-PSEUDOEPHEDRINE ER 5-120 MG PO TB12: 1.0000 | ORAL_TABLET | Freq: Every day | ORAL | 0 refills | Status: AC

## 2024-07-22 MED ORDER — FLUTICASONE PROPIONATE 50 MCG/ACT NA SUSP: 2.0000 | Freq: Every day | NASAL | 0 refills | Status: DC

## 2024-07-22 MED ORDER — MUCINEX DM MAXIMUM STRENGTH 60-1200 MG PO TB12: 1.0000 | ORAL_TABLET | Freq: Two times a day (BID) | ORAL | 0 refills | Status: DC

## 2024-07-22 MED ORDER — PSEUDOEPH-BROMPHEN-DM 30-2-10 MG/5ML PO SYRP: 10.0000 mL | ORAL_SOLUTION | Freq: Four times a day (QID) | ORAL | 0 refills | Status: DC | PRN

## 2024-07-22 NOTE — Discharge Instructions (Addendum)
 Your flu, COVID and strep tests were all negative. Your infection is likely due to a viral sinusitis. Since your illness is caused by a virus, antibiotics won't help because antibiotics only treat infections caused by bacteria. Take medications as prescribed. You may also take tylenol  and/or ibuprofen as needed for pain and/or fever. Drink enough fluid to keep your urine pale yellow. Staying hydrated will also help to thin your mucus. Use a cool mist humidifier to keep the humidity level in your home above 50%. Inhale steam for 10-15 minutes, 3-4 times a day. You can do this in the bathroom while a hot shower is running and/or purchase over-the-counter vapor shower tablets which is great to help with nasal congestion.Try to limit your exposure to cool or dry air. Sleep with your head raised to decrease post-nasal drainage. Make sure you get enough sleep each night. Once you have finished your medication and your symptoms have improved, remember to replace your toothbrush to help prevent re-infection. Follow-up with your primary care provider if your symptoms do not improve after a week or so, or sooner if needed.

## 2024-07-22 NOTE — ED Provider Notes (Signed)
 [facility]    CSN: [id number] Arrival date & time: 07/22/24  1659      History   Chief Complaint Chief Complaint  Patient presents with   Sore Throat    Sneezing, sore throat, coughing, headache. Would like to be screened for Covid and flu if possible. - Entered by patient    HPI Emily Stephens is a 63 y.o. female.   Discussed the use of AI scribe software for clinical note transcription with the patient, who gave verbal consent to proceed.   The patient presents with upper respiratory symptoms that began approximately two days ago, including sneezing, sore throat, cough, headache, body aches, and low-grade fevers. The illness initially started with a scratchy throat and sneezing and has since progressed to include nasal congestion, sinus pressure, and sinus pain. The patient reports a cough associated with postnasal drainage but denies productive sputum, wheezing, or shortness of breath.  Additional symptoms include mild stomach upset and diarrhea. The patient reports recent exposure to illness after attending a meeting last week where another individual was actively coughing and sneezing. For symptom relief, the patient has been using both tablet and liquid forms of Mucinex DM.  The following sections of the patient's history were reviewed and updated as appropriate: allergies, current medications, past family history, past medical history, past social history, past surgical history, and problem list.      Past Medical History:  Diagnosis Date   Allergy    seasonal allergies   Arthritis    bilateral knees   Bone spur    Right Foot   Cataract    not a surgical candidate at this time (10/09/2020)   Depression    on meds   Dermatophytosis of nail    Essential hypertension, benign    on meds   Hyperlipidemia    diet controlled-no meds   Menorrhagia    Obesity    Osteopenia    Other and unspecified hyperlipidemia    Pain in joint, ankle and foot     Unspecified vitamin D  deficiency     Patient Active Problem List   Diagnosis Date Noted   Excessive daytime sleepiness 07/03/2024   Screening for diabetes mellitus 10/26/2023   Vitamin D  deficiency 10/26/2023   Strep pharyngitis 08/21/2023   Apnea 05/09/2023   Dyspnea on exertion 05/09/2023   Family history of heart disease 05/09/2023   Arthritis of knee, right 04/25/2023   Ecchymosis of forearm 01/17/2023   Hirsutism 01/17/2023   Acute non-recurrent sinusitis 08/23/2022   Dysfunction of right eustachian tube 08/23/2022   Class 2 obesity with body mass index (BMI) of 38.0 to 38.9 in adult 07/28/2022   Renal insufficiency 07/28/2022   Loud snoring 07/11/2022   Urinary tract infection with hematuria 12/08/2021   Scar 11/08/2021   Sebaceous cyst 11/08/2021   Other fatigue 11/08/2021   Hair loss 11/08/2021   Depression with anxiety 09/02/2021   Need for shingles vaccine 09/02/2021   Hepatitis B core antibody positive 05/18/2021   Elevated cholesterol 05/18/2021   Body mass index (BMI) 36.0-36.9, adult 02/25/2021   Elevated blood-pressure reading, without diagnosis of hypertension 02/25/2021   Essential hypertension 09/22/2020   Perennial allergic rhinitis with seasonal variation 06/09/2020   Primary osteoarthritis of left knee 02/27/2020   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   Biceps tendinitis 04/16/2019   Carpal tunnel syndrome of right wrist 04/16/2019   Pain in right hand 04/16/2019   Shoulder pain 04/16/2019   Acute medial meniscus tear of  left knee 12/08/2017   Acute pain of left knee 12/08/2017   Gastroesophageal reflux disease without esophagitis 08/10/2014   Hip pain, bilateral 03/30/2014   Lumbago 03/30/2014   Heart burn 12/17/2013   Lower back pain 12/17/2013   Sciatica of left side 12/17/2013   Excessive bleeding in premenopausal period 05/26/2013   Flushing 05/26/2013   Mixed hyperlipidemia 03/20/2003    Past Surgical History:  Procedure Laterality Date    APPENDECTOMY     COLONOSCOPY  2012   WNL-Dr.Peters   MENISCUS REPAIR Left 2019   TONSILLECTOMY      OB History   No obstetric history on file.      Home Medications    Prior to Admission medications   Medication Sig Start Date End Date Taking? Authorizing Provider  brompheniramine-pseudoephedrine-DM 30-2-10 MG/5ML syrup Take 10 mLs by mouth every 6 (six) hours as needed (cough and congestion). 07/22/24  Yes Pharoah Goggins, Lucie, FNP  cetirizine-pseudoephedrine (ZYRTEC-D) 5-120 MG tablet Take 1 tablet by mouth daily with breakfast for 10 days. 07/22/24 08/01/24 Yes Elo Marmolejos, Lucie, FNP  Dextromethorphan-guaiFENesin (MUCINEX DM MAXIMUM STRENGTH) 60-1200 MG TB12 Take 1 tablet by mouth 2 (two) times daily. 07/22/24  Yes Iola Lucie, FNP  fluticasone  (FLONASE ) 50 MCG/ACT nasal spray Place 2 sprays into both nostrils daily. Shake well before use. Gently blow nose before spraying. Do not blow nose immediately after use. You should not taste the medication or feel it going down your throat; if you do, adjust your technique. 07/22/24  Yes Iola Lucie, FNP  estradiol (ESTRACE) 0.1 MG/GM vaginal cream daily as needed. 07/16/20   [provider]  lisinopril  (ZESTRIL ) 20 MG tablet TAKE 1 TABLET(20 MG) BY MOUTH DAILY 01/25/24   Berneta Elsie Sayre, MD  meloxicam  (MOBIC ) 15 MG tablet Take 15 mg by mouth daily. 06/18/24   [provider]  methocarbamol  (ROBAXIN ) 500 MG tablet Take 500 mg by mouth every 6 (six) hours as needed. 03/20/24   [provider]  Vitamin D , Ergocalciferol , (DRISDOL ) 1.25 MG (50000 UNIT) CAPS capsule Take 1 capsule (50,000 Units total) by mouth every 7 (seven) days. 10/31/23   Berneta Elsie Sayre, MD    Family History Family History  Problem Relation Age of Onset   Lung disease Mother    Osteoporosis Mother    Colon polyps Mother 56   CVA Father    Heart disease Brother    Diabetes Maternal Aunt    Breast cancer Maternal Aunt    Diabetes  Maternal Uncle    Aneurysm Sister    Breast cancer Cousin    Esophageal cancer Neg Hx    Colon cancer Neg Hx    Stomach cancer Neg Hx    Rectal cancer Neg Hx     Social History Social History   Tobacco Use   Smoking status: Never   Smokeless tobacco: Never  Vaping Use   Vaping status: Never Used  Substance Use Topics   Alcohol use: Yes    Alcohol/week: 3.0 standard drinks of alcohol    Types: 3 Standard drinks or equivalent per week    Comment: occ   Drug use: No     Allergies   Patient has no known allergies.   Review of Systems Review of Systems  Constitutional:  Positive for fever. Negative for fatigue.  HENT:  Positive for congestion, postnasal drip, sinus pressure, sinus pain, sneezing and sore throat.   Respiratory:  Positive for cough.   Gastrointestinal:  Positive for diarrhea and nausea.  Negative for vomiting.  Musculoskeletal:  Positive for myalgias.  Neurological:  Positive for headaches.  All other systems reviewed and are negative.    Physical Exam Triage Vital Signs ED Triage Vitals  Encounter Vitals Group     BP 07/22/24 1706 (!) 144/77     Girls Systolic BP Percentile --      Girls Diastolic BP Percentile --      Boys Systolic BP Percentile --      Boys Diastolic BP Percentile --      Pulse Rate 07/22/24 1706 (!) 103     Resp 07/22/24 1706 17     Temp 07/22/24 1706 98.2 F (36.8 C)     Temp Source 07/22/24 1706 Oral     SpO2 07/22/24 1706 96 %     Weight --      Height --      Head Circumference --      Peak Flow --      Pain Score 07/22/24 1711 7     Pain Loc --      Pain Education --      Exclude from Growth Chart --    No data found.  Updated Vital Signs BP (!) 144/77 (BP Location: Right Arm)   Pulse (!) 103   Temp 98.2 F (36.8 C) (Oral)   Resp 17   LMP 10/28/2013   SpO2 96%   Visual Acuity Right Eye Distance:   Left Eye Distance:   Bilateral Distance:    Right Eye Near:   Left Eye Near:    Bilateral Near:      Physical Exam Vitals reviewed.  Constitutional:      General: She is awake. She is not in acute distress.    Appearance: Normal appearance. She is well-developed. She is not ill-appearing, toxic-appearing or diaphoretic.  HENT:     Head: Normocephalic.     Right Ear: Tympanic membrane, ear canal and external ear normal. No drainage, swelling or tenderness. No middle ear effusion. Tympanic membrane is not erythematous.     Left Ear: Tympanic membrane, ear canal and external ear normal. No drainage, swelling or tenderness.  No middle ear effusion. Tympanic membrane is not erythematous.     Nose: Congestion present. No rhinorrhea.     Mouth/Throat:     Lips: Pink.     Mouth: Mucous membranes are moist.     Pharynx: No pharyngeal swelling, oropharyngeal exudate, posterior oropharyngeal erythema or uvula swelling.     Tonsils: No tonsillar exudate or tonsillar abscesses.  Eyes:     General: Vision grossly intact.     Conjunctiva/sclera: Conjunctivae normal.  Cardiovascular:     Rate and Rhythm: Normal rate.     Heart sounds: Normal heart sounds.  Pulmonary:     Effort: Pulmonary effort is normal. No tachypnea or respiratory distress.     Breath sounds: Normal breath sounds and air entry.     Comments: Respirations even and unlabored  Musculoskeletal:        General: Normal range of motion.     Cervical back: Normal range of motion and neck supple.  Lymphadenopathy:     Cervical: No cervical adenopathy.  Skin:    General: Skin is warm and dry.  Neurological:     General: No focal deficit present.     Mental Status: She is alert and oriented to person, place, and time.  Psychiatric:        Behavior: Behavior is cooperative.  UC Treatments / Results  Labs (all labs ordered are listed, but only abnormal results are displayed) Labs Reviewed  POCT RAPID STREP A (OFFICE)  POC COVID19/FLU A&B COMBO    EKG   Radiology No results found.  Procedures Procedures  (including critical care time)  Medications Ordered in UC Medications - No data to display  Initial Impression / Assessment and Plan / UC Course  I have reviewed the triage vital signs and the nursing notes.  Pertinent labs & imaging results that were available during my care of the patient were reviewed by me and considered in my medical decision making (see chart for details).     The patient presents with symptoms consistent with viral acute sinusitis. Treatment plan includes prescribed medications and supportive care measures such as hydration, rest, and symptomatic relief. The importance of adherence to prescribed therapy was emphasized. Patient was advised to follow up with their primary care provider as needed and to seek emergency care for worsening or concerning symptoms.  Today's evaluation has revealed no signs of a dangerous process. Discussed diagnosis with patient and/or guardian. Patient and/or guardian aware of their diagnosis, possible red flag symptoms to watch out for and need for close follow up. Patient and/or guardian understands verbal and written discharge instructions. Patient and/or guardian comfortable with plan and disposition.  Patient and/or guardian has a clear mental status at this time, good insight into illness (after discussion and teaching) and has clear judgment to make decisions regarding their care  Documentation was completed with the aid of voice recognition software. Transcription may contain typographical errors.   Final Clinical Impressions(s) / UC Diagnoses   Final diagnoses:  Viral sinusitis     Discharge Instructions      Your flu, COVID and strep tests were all negative. Your infection is likely due to a viral sinusitis. Since your illness is caused by a virus, antibiotics won't help because antibiotics only treat infections caused by bacteria. Take medications as prescribed. You may also take tylenol  and/or ibuprofen as needed for pain  and/or fever. Drink enough fluid to keep your urine pale yellow. Staying hydrated will also help to thin your mucus. Use a cool mist humidifier to keep the humidity level in your home above 50%. Inhale steam for 10-15 minutes, 3-4 times a day. You can do this in the bathroom while a hot shower is running and/or purchase over-the-counter vapor shower tablets which is great to help with nasal congestion.Try to limit your exposure to cool or dry air. Sleep with your head raised to decrease post-nasal drainage. Make sure you get enough sleep each night. Once you have finished your medication and your symptoms have improved, remember to replace your toothbrush to help prevent re-infection. Follow-up with your primary care provider if your symptoms do not improve after a week or so, or sooner if needed.     ED Prescriptions     Medication Sig Dispense Auth. Provider   fluticasone  (FLONASE ) 50 MCG/ACT nasal spray Place 2 sprays into both nostrils daily. Shake well before use. Gently blow nose before spraying. Do not blow nose immediately after use. You should not taste the medication or feel it going down your throat; if you do, adjust your technique. 16 g Iola Lukes, FNP   Dextromethorphan-guaiFENesin (MUCINEX DM MAXIMUM STRENGTH) 60-1200 MG TB12 Take 1 tablet by mouth 2 (two) times daily. 20 tablet Iola Lukes, FNP   brompheniramine-pseudoephedrine-DM 30-2-10 MG/5ML syrup Take 10 mLs by mouth every 6 (six) hours  as needed (cough and congestion). 120 mL Shakesha Soltau, [place], FNP   cetirizine-pseudoephedrine (ZYRTEC-D) 5-120 MG tablet Take 1 tablet by mouth daily with breakfast for 10 days. 10 tablet Iola Lukes, FNP      PDMP not reviewed this encounter.   Iola Lukes, [place] 07/22/24 [phone]

## 2024-07-22 NOTE — ED Triage Notes (Addendum)
 Pt c/o sneezing, sore throat, cough, and sinus headache, low grade fever, and body aches since saturday

## 2024-07-25 ENCOUNTER — Encounter

## 2024-07-25 ENCOUNTER — Ambulatory Visit: Admitting: Nurse Practitioner

## 2024-07-25 DIAGNOSIS — G4719 Other hypersomnia: Secondary | ICD-10-CM

## 2024-08-02 DIAGNOSIS — G4733 Obstructive sleep apnea (adult) (pediatric): Secondary | ICD-10-CM | POA: Diagnosis not present

## 2024-08-06 ENCOUNTER — Ambulatory Visit: Payer: Self-pay | Admitting: Nurse Practitioner

## 2024-08-21 ENCOUNTER — Ambulatory Visit: Admitting: Nurse Practitioner

## 2024-08-26 ENCOUNTER — Ambulatory Visit

## 2024-08-26 VITALS — BP 136/80 | HR 90 | Temp 97.3°F | Ht 63.0 in | Wt 213.2 lb

## 2024-08-26 DIAGNOSIS — Z6837 Body mass index (BMI) 37.0-37.9, adult: Secondary | ICD-10-CM

## 2024-08-26 DIAGNOSIS — E66812 Obesity, class 2: Secondary | ICD-10-CM

## 2024-08-26 DIAGNOSIS — G4733 Obstructive sleep apnea (adult) (pediatric): Secondary | ICD-10-CM

## 2024-08-26 DIAGNOSIS — J301 Allergic rhinitis due to pollen: Secondary | ICD-10-CM

## 2024-08-26 NOTE — Progress Notes (Signed)
 Pulmonology Office Visit   Subjective:  Patient ID: Emily Stephens, female    DOB: 05-14-1961  MRN: [id number]  Referred by: Berneta Elsie Sim DEWAINE  CC:  Chief Complaint  Patient presents with   Follow-up    Review HST from 07/25/2024.    HPI Emily Stephens is a 63 y.o. female non-smoker with hypertension, allergic rhinitis, GERD, hyperlipidemia, obesity class II, depression/anxiety presents as a follow-up for excessive daytime sleepiness.  Seen 07/03/24> ESS 13 with s/s of OSA. HST ordered.   Respective notes from provider reviewed as appropriate to gather relevant information for patient care.   Discussed the use of AI scribe software for clinical note transcription with the patient, who gave verbal consent to proceed.  History of Present Illness   Emily Stephens is a 63 year old female who presents with concerns about sleep apnea.  She experiences snoring and frequent awakenings during the night. Her sleep schedule typically involves going to bed between 10 and 11 PM, taking 15 to 20 minutes to fall asleep, and waking up around 6:30 to 7 AM. She occasionally takes naps in the afternoon, one to two times a week, lasting about 30 minutes to an hour.  She has undergone two knee replacements in the past year, which have contributed to sleep disturbances due to discomfort and pain, necessitating movement or sitting in a recliner. She has discontinued muscle relaxers for her knees and now occasionally uses Tylenol  PM or Aleve PM to aid sleep, preferring Tylenol  due to concerns about Aleve's gastrointestinal side effects and its impact on her skin and bruising.  She experiences fatigue and a lack of energy, which she attributes to weight gain and reduced muscle strength. Prior to her knee surgeries, pain limited her physical activity, affecting her ability to stand or walk for extended periods.  She reports nasal congestion and sinus pain, particularly during dry seasons. She finds  saline spray helpful for her congestion.         OSA history: HST November 2025> moderate OSA   PRIOR TESTS and IMAGING: HST 07/25/2024: AHI 4% 19, AHI 3% 31.4, O2 nadir 85, desaturation 7-minute.  Weight 215 pound.  Banding desaturation   echo 06/2023: EF 65%, grade 1 diastolic dysfunction      No data to display          Allergies: Patient has no known allergies.  Current Outpatient Medications:    estradiol (ESTRACE) 0.1 MG/GM vaginal cream, daily as needed., Disp: , Rfl:    lisinopril  (ZESTRIL ) 20 MG tablet, TAKE 1 TABLET(20 MG) BY MOUTH DAILY, Disp: 30 tablet, Rfl: 0   Multiple Vitamin (MULTIVITAMIN) tablet, Take 1 tablet by mouth daily., Disp: , Rfl:    Vitamin D , Ergocalciferol , (DRISDOL ) 1.25 MG (50000 UNIT) CAPS capsule, Take 1 capsule (50,000 Units total) by mouth every 7 (seven) days., Disp: 12 capsule, Rfl: 3 Past Medical History:  Diagnosis Date   Allergy    seasonal allergies   Arthritis    bilateral knees   Bone spur    Right Foot   Cataract    not a surgical candidate at this time (10/09/2020)   Depression    on meds   Dermatophytosis of nail    Essential hypertension, benign    on meds   Hyperlipidemia    diet controlled-no meds   Menorrhagia    Obesity    Osteopenia    Other and unspecified hyperlipidemia    Pain in joint, ankle and foot  Unspecified vitamin D  deficiency    Past Surgical History:  Procedure Laterality Date   APPENDECTOMY     COLONOSCOPY  2012   WNL-Dr.Peters   MENISCUS REPAIR Left 2019   TONSILLECTOMY     Family History  Problem Relation Age of Onset   Lung disease Mother    Osteoporosis Mother    Colon polyps Mother 64   CVA Father    Heart disease Brother    Diabetes Maternal Aunt    Breast cancer Maternal Aunt    Diabetes Maternal Uncle    Aneurysm Sister    Breast cancer Cousin    Esophageal cancer Neg Hx    Colon cancer Neg Hx    Stomach cancer Neg Hx    Rectal cancer Neg Hx    Social History    Socioeconomic History   Marital status: Married    Spouse name: DENNIS   Number of children: 2   Years of education: 14+   Highest education level: Bachelor's degree (e.g., BA, AB, BS)  Occupational History   Occupation: TEACHER    Employer: OTHER  Tobacco Use   Smoking status: Never   Smokeless tobacco: Never  Vaping Use   Vaping status: Never Used  Substance and Sexual Activity   Alcohol use: Yes    Alcohol/week: 3.0 standard drinks of alcohol    Types: 3 Standard drinks or equivalent per week    Comment: occ   Drug use: No   Sexual activity: Not on file  Other Topics Concern   Not on file  Social History Narrative   Marital Status: Married [place])   Children:  Son Designer, Industrial/product)  Daughter Geophysical Data Processor)    Pets:  Dog    Living Situation: Lives with spouse and children    Occupation: Runner, 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    Education:  Engineer, Maintenance (it) ([employer])    Alcohol Use:  Occasional   Drug Use:  None   Diet:  Regular   Exercise:  Walking   Hobbies: Social Research Officer, Government, Reading, Shopping              Social Drivers of Corporate Investment Banker Strain: Low Risk  (10/26/2023)   Overall Financial Resource Strain (CARDIA)    Difficulty of Paying Living Expenses: Not hard at all  Food Insecurity: No Food Insecurity (10/26/2023)   Hunger Vital Sign    Worried About Running Out of Food in the Last Year: Never true    Ran Out of Food in the Last Year: Never true  Transportation Needs: No Transportation Needs (10/26/2023)   PRAPARE - Administrator, Civil Service (Medical): No    Lack of Transportation (Non-Medical): No  Physical Activity: Insufficiently Active (10/26/2023)   Exercise Vital Sign    Days of Exercise per Week: 2 days    Minutes of Exercise per Session: 20 min  Stress: No Stress Concern Present (10/26/2023)   [employer] of Occupational Health - Occupational Stress Questionnaire    Feeling of Stress : Not at all  Social Connections: Unknown  (10/26/2023)   Social Connection and Isolation Panel    Frequency of Communication with Friends and Family: More than three times a week    Frequency of Social Gatherings with Friends and Family: Twice a week    Attends Religious Services: More than 4 times per year    Active Member of [employer] or Organizations: Patient declined    Attends Banker Meetings: Not on file  Marital Status: Married  Catering Manager Violence: Not on file       Objective:  BP 136/80   Pulse 90   Temp (!) 97.3 F (36.3 C) (Oral)   Ht 5' 3 (1.6 m)   Wt 213 lb 3.2 oz (96.7 kg)   LMP 10/28/2013   SpO2 97% Comment: room air  BMI 37.77 kg/m  BMI Readings from Last 3 Encounters:  08/26/24 37.77 kg/m  07/03/24 38.44 kg/m  06/20/24 38.33 kg/m    Physical Exam: Physical Exam   ENT: Normal mucosa. No hypertrophy of inferior turbinates. Tonsils are normal sized. Modified Mallampati score is normal. Oral cavity normal. PULMONARY: Lungs clear to auscultation bilaterally, no adventitious breath sounds. CARDIOVASCULAR: Regular rate and rhythm, S1 S2 normal, no murmurs. ABDOMEN: Abdomen soft, nontender. Bowel sounds are normal. EXTREMITIES: No peripheral edema noted.       Diagnostic Review:  Last metabolic panel Lab Results  Component Value Date   GLUCOSE 90 06/20/2024   NA 138 06/20/2024   K 4.4 06/20/2024   CL 102 06/20/2024   CO2 26 06/20/2024   BUN 21 06/20/2024   CREATININE 0.90 06/20/2024   GFR 67.95 06/20/2024   CALCIUM  9.7 06/20/2024   PROT 7.0 07/25/2022   ALBUMIN 4.4 07/25/2022   BILITOT 0.4 07/25/2022   ALKPHOS 81 07/25/2022   AST 12 07/25/2022   ALT 17 07/25/2022         Assessment & Plan:   Assessment & Plan OSA (obstructive sleep apnea)  Orders:   Ambulatory Referral for DME  Obesity, class 2  Orders:   Amb Ref to Medical Weight Management  Allergic rhinitis due to pollen, unspecified seasonality        Assessment and Plan    Obstructive sleep  apnea, moderate to severe Moderate to severe obstructive sleep apnea with AHI of 19 to 31 events per hour. CPAP therapy is the preferred treatment. Discussed CPAP mask options and importance of consistent use. Explained alternative treatments and impact of weight loss. - Initiated CPAP therapy with a hybrid mask. 8-12 cm H2O.  - Instructed on gradual acclimatization to CPAP use. - Ensured CPAP usage for at least four hours per night. - Briefly discussed ispire and MAD.  MAD not suitable.   Obesity, class 2 Class 2 obesity exacerbating sleep apnea. Discussed weight loss impact on sleep apnea and health. Explored weight loss medications contingent on lifestyle changes and insurance approval. - Encouraged lifestyle modifications including diet and exercise. - Discussed potential referral to a weight loss clinic. - Will consider weight loss medications like tirzepatide  if lifestyle changes are insufficient and insurance approves.  Allergic rhinitis Causing nasal congestion affecting CPAP use. Discussed saline spray and Flonase  for relief. - Recommended saline spray for nasal congestion as needed. - Advised use of Flonase , two sprays in each nostril once daily, reducing to one spray after seven days if tolerated.         Notes from 07/03/24 reviewed as to gather relevant information for patient care and formulating plan.   Return for 1 month after CPAP setup.   I personally spent a total of 30 minutes in the care of the patient today including preparing to see the patient, getting/reviewing separately obtained history, performing a medically appropriate exam/evaluation, counseling and educating, placing orders, documenting clinical information in the EHR, independently interpreting results, and communicating results.   Marveen Donlon, MD

## 2024-08-26 NOTE — Patient Instructions (Addendum)
 We did discuss about need for compliance to meet insurance requirement.  The patient has to use CPAP for at least 5 out of 7 days in a week for a consecutive month within the first 3 months after getting CPAP.   VISIT SUMMARY:  During your visit, we discussed your concerns about sleep apnea, weight gain, and nasal congestion. We reviewed your symptoms and developed a plan to address each issue.  YOUR PLAN:  -OBSTRUCTIVE SLEEP APNEA, MODERATE TO SEVERE: Obstructive sleep apnea is a condition where your airway becomes blocked during sleep, causing breathing interruptions. We have started you on CPAP therapy with a hybrid mask to help keep your airway open.   -OBESITY, CLASS 2: Class 2 obesity means having a body mass index (BMI) of 35 to 39.9, which can worsen sleep apnea and affect overall health. We talked about making lifestyle changes, including diet and exercise, to help with weight loss. We may consider weight loss medications like tirzepatide  if lifestyle changes alone are not effective and if your insurance approves it.  -ALLERGIC RHINITIS: Allergic rhinitis is inflammation of the nasal passages caused by allergies, leading to congestion and sinus pain. This can affect your CPAP use. We recommend using saline spray as needed for congestion and Flonase , two sprays in each nostril once daily, reducing to one spray after seven days if tolerated.                        Contains text generated by Abridge.                                 Contains text generated by Abridge.

## 2024-09-03 ENCOUNTER — Other Ambulatory Visit: Payer: Self-pay | Admitting: Family Medicine

## 2024-09-03 DIAGNOSIS — I10 Essential (primary) hypertension: Secondary | ICD-10-CM

## 2024-09-03 MED ORDER — LISINOPRIL 20 MG PO TABS: 20.0000 mg | ORAL_TABLET | Freq: Every day | ORAL | 0 refills | Status: DC

## 2024-09-03 NOTE — Telephone Encounter (Signed)
 Copied from CRM #[id number]. Topic: Clinical - Medication Refill >> Sep 03, 2024  3:03 PM Pinkey ORN wrote: Medication: lisinopril  (ZESTRIL ) 20 MG tablet  Has the patient contacted their pharmacy? Yes (Agent: If no, request that the patient contact the pharmacy for the refill. If patient does not wish to contact the pharmacy document the reason why and proceed with request.) (Agent: If yes, when and what did the pharmacy advise?)  This is the patient's preferred pharmacy:  [id number] - [place], [place] - [address] [place] [address] Phone: [phone] Fax: [phone]  Is this the correct pharmacy for this prescription? Yes If no, delete pharmacy and type the correct one.   Has the prescription been filled recently? No  Is the patient out of the medication? No  Has the patient been seen for an appointment in the last year OR does the patient have an upcoming appointment? Yes  Can we respond through MyChart? Yes  Agent: Please be advised that Rx refills may take up to 3 business days. We ask that you follow-up with your pharmacy.

## 2024-09-04 ENCOUNTER — Telehealth: Payer: Self-pay

## 2024-09-04 NOTE — Telephone Encounter (Signed)
 Received CMN from [facility] for CPAP supplies. Placed into Dr. Lavena sign folder in A Pod. Once signed, will fax back to [facility] at [phone]

## 2024-09-09 DIAGNOSIS — Z0289 Encounter for other administrative examinations: Secondary | ICD-10-CM

## 2024-09-09 NOTE — Progress Notes (Unsigned)
 " Office: [phone]  /  Fax: [phone]   Initial Visit    Emily Stephens was seen in clinic today to evaluate for obesity. She is interested in losing weight to improve overall health and reduce the risk of weight related complications. She presents today to review program treatment options, initial physical assessment, and evaluation.     She was referred by: {emreferby:28303}  When asked what else they would like to accomplish? She states: {EMHopetoaccomplish:28304::Adopt a healthier eating pattern and lifestyle,Improve energy levels and physical activity,Improve existing medical conditions,Improve quality of life}  When asked how has your weight affected you? She states: {EMWeightAffected:28305}  Weight history: ***  Highest weight: ***  Some associated conditions: {EMSomeConditions:28306}  Contributing factors: {EMcontributingfactors:28307}  Weight promoting medications identified: {EMWeightpromotingrx:28308}  Prior weight loss attempts: {emweightlossprograms:31590::None}  Current nutrition plan: {EMNutritionplan:28309::None}  Current level of physical activity: {EMcurrentPA:28310::None}  Current or previous pharmacotherapy: {EM previousRx:28311}  Response to medication: {EMResponsetomedication:28312}   Past medical history includes:   Past Medical History:  Diagnosis Date   Allergy    seasonal allergies   Arthritis    bilateral knees   Bone spur    Right Foot   Cataract    not a surgical candidate at this time (10/09/2020)   Depression    on meds   Dermatophytosis of nail    Essential hypertension, benign    on meds   Hyperlipidemia    diet controlled-no meds   Menorrhagia    Obesity    Osteopenia    Other and unspecified hyperlipidemia    Pain in joint, ankle and foot    Unspecified vitamin D  deficiency      Objective    LMP 10/28/2013  She was weighed on the bioimpedance scale: There is no height or weight on file to  calculate BMI.  Body Fat%:***, Visceral Fat Rating:***, Weight trend over the last 12 months: {emweighttrend:28333}  General:  Alert, oriented and cooperative. Patient is in no acute distress.  Respiratory: Normal respiratory effort, no problems with respiration noted   Gait: able to ambulate independently  Mental Status: Normal mood and affect. Normal behavior. Normal judgment and thought content.   DIAGNOSTIC DATA REVIEWED:  BMET    Component Value Date/Time   NA 138 06/20/2024 1638   NA 140 12/09/2022 1418   K 4.4 06/20/2024 1638   CL 102 06/20/2024 1638   CO2 26 06/20/2024 1638   GLUCOSE 90 06/20/2024 1638   BUN 21 06/20/2024 1638   BUN 15 12/09/2022 1418   CREATININE 0.90 06/20/2024 1638   CREATININE 0.70 12/23/2013 0933   CALCIUM  9.7 06/20/2024 1638   GFRNONAA >89 12/23/2013 0933   GFRAA >89 12/23/2013 0933   Lab Results  Component Value Date   HGBA1C 5.6 10/30/2023   HGBA1C 5.6 12/09/2022   No results found for: INSULIN CBC    Component Value Date/Time   WBC 9.4 07/11/2022 1344   RBC 5.24 (H) 07/11/2022 1344   HGB 14.5 07/11/2022 1344   HCT 42.9 07/11/2022 1344   PLT 280.0 07/11/2022 1344   MCV 82.0 07/11/2022 1344   MCH 27.3 12/23/2013 0933   MCHC 33.8 07/11/2022 1344   RDW 13.5 07/11/2022 1344   Iron/TIBC/Ferritin/ %Sat No results found for: IRON, TIBC, FERRITIN, IRONPCTSAT Lipid Panel     Component Value Date/Time   CHOL 175 10/30/2023 0945   TRIG 146.0 10/30/2023 0945   HDL 68.30 10/30/2023 0945   CHOLHDL 3 10/30/2023 0945   VLDL 29.2 10/30/2023 0945  LDLCALC 77 10/30/2023 0945   LDLDIRECT 114.0 07/11/2022 1344   Hepatic Function Panel     Component Value Date/Time   PROT 7.0 07/25/2022 1517   ALBUMIN 4.4 07/25/2022 1517   AST 12 07/25/2022 1517   ALT 17 07/25/2022 1517   ALKPHOS 81 07/25/2022 1517   BILITOT 0.4 07/25/2022 1517      Component Value Date/Time   TSH 0.657 12/09/2022 1418     Assessment and Plan   There  are no diagnoses linked to this encounter.  Assessment and Plan Assessment & Plan         Obesity Treatment / Action Plan:  {EMobesityactionplanscribe:28314::Patient will work on garnering support from family and friends to begin weight loss journey.,Will work on eliminating or reducing the presence of highly palatable, calorie dense foods in the home.,Will complete provided nutritional and psychosocial assessment questionnaire before the next appointment.,Will be scheduled for indirect calorimetry to determine resting energy expenditure in a fasting state.  This will allow us  to create a reduced calorie, high-protein meal plan to promote loss of fat mass while preserving muscle mass.,Counseled on the health benefits of losing 5%-15% of total body weight.,Was counseled on nutritional approaches to weight loss and benefits of reducing processed foods and consuming plant-based foods and high quality protein as part of nutritional weight management.,Was counseled on pharmacotherapy and role as an adjunct in weight management. }  Obesity Education Performed Today:  She was weighed on the bioimpedance scale and results were discussed and documented in the synopsis.  We discussed obesity as a disease and the importance of a more detailed evaluation of all the factors contributing to the disease.  We discussed the importance of long term lifestyle changes which include nutrition, exercise and behavioral modifications as well as the importance of customizing this to her specific health and social needs.  We discussed the benefits of reaching a healthier weight to alleviate the symptoms of existing conditions and reduce the risks of the biomechanical, metabolic and psychological effects of obesity.  We reviewed the four pillars of obesity medicine and importance of using a multimodal approach.  We reviewed the basic principles in weight management.   Emily Stephens appears to be in  the action stage of change and states they are ready to start intensive lifestyle modifications and behavioral modifications.  I have spent *** minutes in the care of the patient today including: {NUMBER 1-10:22536} minutes before the visit reviewing and preparing the chart. *** minutes face-to-face {emfacetoface:32598::assessing and reviewing listed medical problems as outlined in obesity care plan,providing nutritional and behavioral counseling on topics outlined in the obesity care plan,independently interpreting test results and goals of care, as described in assessment and plan,reviewing and discussing biometric information and progress} {NUMBER 1-10:22536} minutes after the visit updating chart and documentation of encounter.  Reviewed by clinician on day of visit: allergies, medications, problem list, medical history, surgical history, family history, social history, and previous encounter notes pertinent to obesity diagnosis.   Brannen Koppen,PA-C     "

## 2024-09-10 ENCOUNTER — Encounter (INDEPENDENT_AMBULATORY_CARE_PROVIDER_SITE_OTHER): Payer: Self-pay | Admitting: Physician Assistant

## 2024-09-10 ENCOUNTER — Ambulatory Visit (INDEPENDENT_AMBULATORY_CARE_PROVIDER_SITE_OTHER): Admitting: Physician Assistant

## 2024-09-10 ENCOUNTER — Ambulatory Visit: Admitting: Family Medicine

## 2024-09-10 VITALS — BP 137/80 | HR 90 | Temp 98.1°F | Ht 61.5 in | Wt 207.4 lb

## 2024-09-10 DIAGNOSIS — Z8249 Family history of ischemic heart disease and other diseases of the circulatory system: Secondary | ICD-10-CM

## 2024-09-10 DIAGNOSIS — R0609 Other forms of dyspnea: Secondary | ICD-10-CM

## 2024-09-10 DIAGNOSIS — M1711 Unilateral primary osteoarthritis, right knee: Secondary | ICD-10-CM

## 2024-09-10 DIAGNOSIS — E782 Mixed hyperlipidemia: Secondary | ICD-10-CM

## 2024-09-10 DIAGNOSIS — G5601 Carpal tunnel syndrome, right upper limb: Secondary | ICD-10-CM

## 2024-09-10 DIAGNOSIS — E66812 Obesity, class 2: Secondary | ICD-10-CM

## 2024-09-10 DIAGNOSIS — E7800 Pure hypercholesterolemia, unspecified: Secondary | ICD-10-CM | POA: Diagnosis not present

## 2024-09-10 DIAGNOSIS — K219 Gastro-esophageal reflux disease without esophagitis: Secondary | ICD-10-CM | POA: Diagnosis not present

## 2024-09-10 DIAGNOSIS — R0683 Snoring: Secondary | ICD-10-CM

## 2024-09-10 DIAGNOSIS — N289 Disorder of kidney and ureter, unspecified: Secondary | ICD-10-CM

## 2024-09-10 DIAGNOSIS — E559 Vitamin D deficiency, unspecified: Secondary | ICD-10-CM | POA: Diagnosis not present

## 2024-09-10 DIAGNOSIS — F418 Other specified anxiety disorders: Secondary | ICD-10-CM

## 2024-09-10 DIAGNOSIS — M1712 Unilateral primary osteoarthritis, left knee: Secondary | ICD-10-CM

## 2024-09-10 DIAGNOSIS — G4719 Other hypersomnia: Secondary | ICD-10-CM

## 2024-09-10 DIAGNOSIS — L659 Nonscarring hair loss, unspecified: Secondary | ICD-10-CM

## 2024-09-10 DIAGNOSIS — Z6838 Body mass index (BMI) 38.0-38.9, adult: Secondary | ICD-10-CM | POA: Diagnosis not present

## 2024-09-10 DIAGNOSIS — G4733 Obstructive sleep apnea (adult) (pediatric): Secondary | ICD-10-CM

## 2024-09-10 DIAGNOSIS — L680 Hirsutism: Secondary | ICD-10-CM

## 2024-09-10 DIAGNOSIS — M545 Low back pain, unspecified: Secondary | ICD-10-CM

## 2024-09-10 DIAGNOSIS — J302 Other seasonal allergic rhinitis: Secondary | ICD-10-CM

## 2024-09-10 DIAGNOSIS — I10 Essential (primary) hypertension: Secondary | ICD-10-CM

## 2024-09-27 ENCOUNTER — Ambulatory Visit: Admitting: Family Medicine

## 2024-09-27 ENCOUNTER — Encounter: Payer: Self-pay | Admitting: Family Medicine

## 2024-09-27 VITALS — BP 123/83 | HR 79 | Ht 63.0 in | Wt 214.2 lb

## 2024-09-27 DIAGNOSIS — Z0000 Encounter for general adult medical examination without abnormal findings: Secondary | ICD-10-CM

## 2024-09-27 DIAGNOSIS — L680 Hirsutism: Secondary | ICD-10-CM

## 2024-09-27 DIAGNOSIS — Z7689 Persons encountering health services in other specified circumstances: Secondary | ICD-10-CM | POA: Diagnosis not present

## 2024-09-27 DIAGNOSIS — E559 Vitamin D deficiency, unspecified: Secondary | ICD-10-CM | POA: Diagnosis not present

## 2024-09-27 DIAGNOSIS — Z6836 Body mass index (BMI) 36.0-36.9, adult: Secondary | ICD-10-CM | POA: Diagnosis not present

## 2024-09-27 DIAGNOSIS — Z780 Asymptomatic menopausal state: Secondary | ICD-10-CM | POA: Diagnosis not present

## 2024-09-27 DIAGNOSIS — L659 Nonscarring hair loss, unspecified: Secondary | ICD-10-CM | POA: Diagnosis not present

## 2024-09-27 DIAGNOSIS — I10 Essential (primary) hypertension: Secondary | ICD-10-CM

## 2024-09-27 DIAGNOSIS — G4733 Obstructive sleep apnea (adult) (pediatric): Secondary | ICD-10-CM

## 2024-09-27 MED ORDER — ESTRADIOL 0.01 % VA CREA: TOPICAL_CREAM | VAGINAL | 12 refills | Status: AC

## 2024-09-27 MED ORDER — MINOXIDIL 2.5 MG PO TABS: 1.2500 mg | ORAL_TABLET | Freq: Every day | ORAL | 1 refills | Status: DC

## 2024-09-27 NOTE — Progress Notes (Unsigned)
 "  new Patient Office Visit  Subjective   Patient ID: Emily Stephens, female    DOB: Jan 24, 1961  Age: 64 y.o. MRN: [id number]  Chief Complaint  Patient presents with   Establish Care     History of Present Illness   Emily Stephens is a 64 year old female who presents for evaluation of menopausal symptoms and medication management.  She is experiencing menopausal symptoms, including hair loss and previous severe hot flashes. She has used estradiol  cream intermittently for these symptoms but is concerned about the risks associated with hormone therapy. She has tried finasteride for hair loss in the past but discontinued it due to adverse skin reactions.  She has a history of hypertension, managed with lisinopril . She also mentions a past prescription for vitamin D , which she took temporarily due to decreased outdoor activity following knee surgeries.  She reports that a home sleep study facilitated by a pulmonologist showed sleep apnea, and she has started using a CPAP machine. She reports difficulty adjusting to the CPAP, particularly with mask fit and mouth opening during sleep.   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 Her surgical history also includes tonsillectomy, appendectomy, and cataract surgeries.  Her family history is significant for cardiovascular issues, with multiple relatives having died from strokes or heart attacks. Her mother had interstitial lung disease and scoliosis, affecting her respiratory health.  Socially, she is a retired runner, broadcasting/film/video, married, with two children and grandchildren.           The 10-year ASCVD risk score (Arnett DK, et al., 2019) is: 4.6%  Health Maintenance Due  Topic Date Due   HIV Screening  Never done   Cervical Cancer Screening (HPV/Pap Cotest)  09/03/2021   Colonoscopy  10/24/2023   COVID-19 Vaccine (6 - 2025-26 season) 05/20/2024      Objective:     BP  123/83   Pulse 79   Ht 5' 3 (1.6 m)   Wt 214 lb 4 oz (97.2 kg)   LMP 10/28/2013   SpO2 96%   BMI 37.95 kg/m    Physical Exam     Gen: alert, oriented HEENT: perrla, eomi, mmm.  Diffuse hair loss on the crown of the head CV: rrr, no murmur Pulm: lctab. No wheeze or crackles.  GI: soft, nbs.  Nontender to palpation MSK: strength equal b/l. Normal gait Ext: no pedal edema Skin: warm and dry, no rashes Psych: pleasant affect.  Spontaneous speech       No results found for any visits on 09/27/24.      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 Topical minoxidil  causes dry scalp.  Oral minoxidil  considered with side effect discussion. - Prescribed oral minoxidil  1.25 mg daily. - Ordered aldosterone  level test. - Scheduled follow-up in 4 months.   OSA (obstructive sleep apnea) Assessment & Plan: Moderate sleep apnea with CPAP use. Discussed alternative treatments and CPAP mask options. - Continue CPAP therapy. - Consider chin strap. - Provided information on mouth guard options if she does not tolerate cpap   Essential hypertension Assessment & Plan: Managed with lisinopril . Discussed sleep apnea's impact on blood pressure. - Continue lisinopril .   Healthcare maintenance Assessment & Plan: Routine health maintenance discussed, including colonoscopy and mammogram scheduling. - Ordered lab tests including vitamin D  level. - Ensure  follow-up for colonoscopy and mammogram.    Post-menopausal Assessment & Plan: Vaginal atrophy and dryness. Discussed estradiol  cream benefits and risks and limitations.  - refilled estradiol  cream.   Other orders -     Minoxidil ; Take 0.5 tablets (1.25 mg total) by mouth daily.  Dispense: 90 tablet; Refill: 1 -     Estradiol ; Apply 1 gram per vagina every night for 2 weeks, then apply three times a week  Dispense: 30 g; Refill: 12      Return in about 4 months (around 01/25/2025) for hair, .    Emily MARLA Slain, MD  "

## 2024-09-27 NOTE — Patient Instructions (Signed)
" °  VISIT SUMMARY: Today, we discussed your menopausal symptoms, hair loss, sleep apnea, and hypertension management. We also reviewed your general health maintenance needs.  YOUR PLAN: FEMALE PATTERN HAIR LOSS: You have chronic scalp hair loss that worsened with finasteride. -Start taking oral minoxidil  1.25 mg daily. -We ordered an aldosterone level test. -Follow up in 4 months.  MENOPAUSAL STATE WITH ATROPHIC VAGINITIS: You are experiencing vaginal atrophy and dryness. -Start using estradiol  cream as prescribed.  OBSTRUCTIVE SLEEP APNEA: You have moderate sleep apnea and are using a CPAP machine. -Continue using your CPAP machine. -Consider using a chin strap. -We provided information on mouth guard options.  HYPERTENSION: Your hypertension is managed with lisinopril . -Continue taking lisinopril  as prescribed.  GENERAL HEALTH MAINTENANCE: We discussed routine health maintenance. -We ordered lab tests including a vitamin D  level. -Ensure you follow up for a colonoscopy and mammogram.   "

## 2024-09-28 ENCOUNTER — Encounter: Payer: Self-pay | Admitting: Family Medicine

## 2024-09-28 DIAGNOSIS — Z780 Asymptomatic menopausal state: Secondary | ICD-10-CM | POA: Insufficient documentation

## 2024-09-28 DIAGNOSIS — Z0000 Encounter for general adult medical examination without abnormal findings: Secondary | ICD-10-CM | POA: Insufficient documentation

## 2024-09-28 NOTE — Assessment & Plan Note (Signed)
 Vaginal atrophy and dryness. Discussed estradiol  cream benefits and risks and limitations.  - refilled estradiol  cream.

## 2024-09-28 NOTE — Assessment & Plan Note (Signed)
 Routine health maintenance discussed, including colonoscopy and mammogram scheduling. - Ordered lab tests including vitamin D  level. - Ensure follow-up for colonoscopy and mammogram.

## 2024-09-28 NOTE — Assessment & Plan Note (Signed)
 Managed with lisinopril . Discussed sleep apnea's impact on blood pressure. - Continue lisinopril .

## 2024-09-28 NOTE — Assessment & Plan Note (Signed)
 Moderate sleep apnea with CPAP use. Discussed alternative treatments and CPAP mask options. - Continue CPAP therapy. - Consider chin strap. - Provided information on mouth guard options if she does not tolerate cpap

## 2024-09-28 NOTE — Assessment & Plan Note (Signed)
 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 Topical minoxidil  causes dry scalp.  Oral minoxidil  considered with side effect discussion. - Prescribed oral minoxidil  1.25 mg daily. - Ordered aldosterone level test. - Scheduled follow-up in 4 months.

## 2024-10-01 ENCOUNTER — Other Ambulatory Visit

## 2024-10-03 ENCOUNTER — Encounter

## 2024-10-03 VITALS — Ht 62.0 in | Wt 207.0 lb

## 2024-10-03 DIAGNOSIS — Z860100 Personal history of colon polyps, unspecified: Secondary | ICD-10-CM

## 2024-10-03 MED ORDER — NA SULFATE-K SULFATE-MG SULF 17.5-3.13-1.6 GM/177ML PO SOLN: 1.0000 | Freq: Once | ORAL | 0 refills | Status: AC

## 2024-10-03 NOTE — Progress Notes (Signed)

## 2024-10-04 ENCOUNTER — Ambulatory Visit: Payer: Self-pay | Admitting: Family Medicine

## 2024-10-07 ENCOUNTER — Encounter: Payer: Self-pay | Admitting: Internal Medicine

## 2024-10-08 ENCOUNTER — Telehealth (INDEPENDENT_AMBULATORY_CARE_PROVIDER_SITE_OTHER): Payer: Self-pay | Admitting: Family Medicine

## 2024-10-08 LAB — CBC WITH DIFFERENTIAL/PLATELET
Basophils Absolute: 0 x10E3/uL (ref 0.0–0.2)
Basos: 1 %
EOS (ABSOLUTE): 0.1 x10E3/uL (ref 0.0–0.4)
Eos: 2 %
Hematocrit: 49.8 % — ABNORMAL HIGH (ref 34.0–46.6)
Hemoglobin: 16.2 g/dL — ABNORMAL HIGH (ref 11.1–15.9)
Immature Grans (Abs): 0 x10E3/uL (ref 0.0–0.1)
Immature Granulocytes: 0 %
Lymphocytes Absolute: 1.9 x10E3/uL (ref 0.7–3.1)
Lymphs: 29 %
MCH: 27.6 pg (ref 26.6–33.0)
MCHC: 32.5 g/dL (ref 31.5–35.7)
MCV: 85 fL (ref 79–97)
Monocytes Absolute: 0.6 x10E3/uL (ref 0.1–0.9)
Monocytes: 9 %
Neutrophils Absolute: 4 x10E3/uL (ref 1.4–7.0)
Neutrophils: 58 %
Platelets: 247 x10E3/uL (ref 150–450)
RBC: 5.86 x10E6/uL — ABNORMAL HIGH (ref 3.77–5.28)
RDW: 13.1 % (ref 11.7–15.4)
WBC: 6.6 x10E3/uL (ref 3.4–10.8)

## 2024-10-08 LAB — LIPID PANEL
Chol/HDL Ratio: 3.5 ratio (ref 0.0–4.4)
Cholesterol, Total: 190 mg/dL (ref 100–199)
HDL: 55 mg/dL
LDL Chol Calc (NIH): 108 mg/dL — ABNORMAL HIGH (ref 0–99)
Triglycerides: 152 mg/dL — ABNORMAL HIGH (ref 0–149)
VLDL Cholesterol Cal: 27 mg/dL (ref 5–40)

## 2024-10-08 LAB — COMPREHENSIVE METABOLIC PANEL WITH GFR
ALT: 23 IU/L (ref 0–32)
AST: 16 IU/L (ref 0–40)
Albumin: 4.9 g/dL (ref 3.9–4.9)
Alkaline Phosphatase: 121 IU/L (ref 49–135)
BUN/Creatinine Ratio: 19 (ref 12–28)
BUN: 17 mg/dL (ref 8–27)
Bilirubin Total: 0.5 mg/dL (ref 0.0–1.2)
CO2: 20 mmol/L (ref 20–29)
Calcium: 10.5 mg/dL — ABNORMAL HIGH (ref 8.7–10.3)
Chloride: 99 mmol/L (ref 96–106)
Creatinine, Ser: 0.89 mg/dL (ref 0.57–1.00)
Globulin, Total: 2.4 g/dL (ref 1.5–4.5)
Glucose: 100 mg/dL — ABNORMAL HIGH (ref 70–99)
Potassium: 5 mmol/L (ref 3.5–5.2)
Sodium: 136 mmol/L (ref 134–144)
Total Protein: 7.3 g/dL (ref 6.0–8.5)
eGFR: 73 mL/min/1.73

## 2024-10-08 LAB — HEMOGLOBIN A1C
Est. average glucose Bld gHb Est-mCnc: 108 mg/dL
Hgb A1c MFr Bld: 5.4 % (ref 4.8–5.6)

## 2024-10-08 LAB — TSH: TSH: 1.35 u[IU]/mL (ref 0.450–4.500)

## 2024-10-08 LAB — ALDOSTERONE + RENIN ACTIVITY W/ RATIO
Aldos/Renin Ratio: 1 (ref 0.0–20.0)
Aldosterone: 23.5 ng/dL (ref 0.0–30.0)
Renin Activity, Plasma: 24.023 ng/mL/h — ABNORMAL HIGH (ref 0.167–5.380)

## 2024-10-08 LAB — VITAMIN D 25 HYDROXY (VIT D DEFICIENCY, FRACTURES): Vit D, 25-Hydroxy: 48.5 ng/mL (ref 30.0–100.0)

## 2024-10-08 NOTE — Telephone Encounter (Signed)
 New Patient Script Check List Patient Acknowledges Each Item Below:  []   There is a one-time $99.00 Program Fee.  []   You must have a Body Mass Index (BMI) of 30 or higher to qualify for our program.  [x]   Once you become a New Patient and schedule your first appointment, you are responsible for completing our New Patient Packet.  [x]   Prior to the first appointment, you will be required to fast for eight hours  [x]   You must also drink plenty of water that day before and morning of your new patient appointment.  [x]   You will also have an electrocardiogram (EKG) during your New Patient visit  do not apply any lotions or creams prior to the appointment.   [x]   You will also have a metabolic breathing test known as the Indirect Calorimetry (IC) Test  [x]   We are a Specialty office, but we bill as a Primary Care. You will have the same co-payments and co-insurances as you would at your primary care provider's office.  [x]   We have a 5-minute grace period. If you arrive later than this time, we will need to reschedule your appointment to a later time or date.  []   There is a $50 fee if the initial consultation visit, New Patient Appointment, or First Follow-up Appointment is a no-show, so please make sure to pick a time that works with your schedule.   []   We recommend that all our patients sign up for and utilize [employer].

## 2024-10-09 ENCOUNTER — Encounter (INDEPENDENT_AMBULATORY_CARE_PROVIDER_SITE_OTHER): Payer: Self-pay | Admitting: Family Medicine

## 2024-10-09 ENCOUNTER — Ambulatory Visit (INDEPENDENT_AMBULATORY_CARE_PROVIDER_SITE_OTHER): Admitting: Family Medicine

## 2024-10-09 VITALS — BP 129/76 | HR 69 | Temp 97.9°F | Ht 61.5 in | Wt 209.0 lb

## 2024-10-09 DIAGNOSIS — Z6838 Body mass index (BMI) 38.0-38.9, adult: Secondary | ICD-10-CM | POA: Diagnosis not present

## 2024-10-09 DIAGNOSIS — R0602 Shortness of breath: Secondary | ICD-10-CM | POA: Diagnosis not present

## 2024-10-09 DIAGNOSIS — R5383 Other fatigue: Secondary | ICD-10-CM

## 2024-10-09 DIAGNOSIS — R739 Hyperglycemia, unspecified: Secondary | ICD-10-CM

## 2024-10-09 DIAGNOSIS — I10 Essential (primary) hypertension: Secondary | ICD-10-CM

## 2024-10-09 DIAGNOSIS — Z1331 Encounter for screening for depression: Secondary | ICD-10-CM | POA: Diagnosis not present

## 2024-10-09 DIAGNOSIS — G4733 Obstructive sleep apnea (adult) (pediatric): Secondary | ICD-10-CM

## 2024-10-09 DIAGNOSIS — E669 Obesity, unspecified: Secondary | ICD-10-CM

## 2024-10-09 DIAGNOSIS — E782 Mixed hyperlipidemia: Secondary | ICD-10-CM | POA: Diagnosis not present

## 2024-10-09 NOTE — Progress Notes (Signed)
 "  Office: [phone]  /  Fax: [phone]  WEIGHT SUMMARY AND BIOMETRICS  Anthropometric Measurements Height: 5' 1.5 (1.562 m) Weight: 209 lb (94.8 kg) BMI (Calculated): 38.86 Starting Weight: 209 lb Peak Weight: 235 lb Waist Measurement : 43 inches   Body Composition  Body Fat %: 47.7 % Fat Mass (lbs): 99.6 lbs Muscle Mass (lbs): 103.8 lbs Total Body Water (lbs): 78.2 lbs Visceral Fat Rating : 15   Other Clinical Data RMR: 1584 Fasting: yes Labs: yes Today's Visit #: 1 Starting Date: 10/09/24    Chief Complaint: OBESITY     History of Present Illness Emily Stephens is a 64 year old female with obesity, obstructive sleep apnea, and hypertension who presents for an initial workup for obesity management. She was referred by her pulmonologist for evaluation of her sleep apnea.  She has not undergone weight loss surgery, considering it too invasive. She exercises by walking two to three times a week for 20 to 30 minutes. Her desired weight is 140 pounds. She started gaining weight after childbirth and identifies stress eating and cravings for fatty foods and sweets as contributing factors. She has tried various weight loss programs, finding Weight Wat[employer] most effective due to its structure and accountability. She eats outside the home 8 to 10 times per week, including fast food or takeout twice a week. She dislikes cooking and prefers eating out for convenience. She experiences sweet cravings, particularly in the afternoons, and tends to snack on candy, nuts, and crackers. She does not feel she has issues with portion control but tends to eat quickly and while multitasking. She experiences guilt over poor food choices and feel judged about her weight growing up.  She has obstructive sleep apnea with a positive Epworth Sleepiness Scale score of 10. She sleeps 6 to 7 hours most nights and wakes up tired. Her daughter reports snoring and observed apneic episodes. She  experiences morning headaches 2 to 3 times per week and has woken up gasping for air. She is using a CPAP machine and is trying different masks to find the best fit, with some improvement in symptoms.  She is on lisinopril  20 mg daily for hypertension, with blood pressure controlled at 129/76 mmHg.  Recent labs show glucose at 100 mg/dL and hemoglobin J8r at 4.5%. Cholesterol levels show triglycerides at 152 mg/dL and LDL at 891 mg/dL. She is not on a statin. Vitamin D  level is 48.5 ng/mL, and hemoglobin is elevated at 16.2 g/dL. An EKG shows NSR and a ventricular rate of 59 beats per minute. IC shows an REE of 1584, which is close to expected for her age/sex/height/weight.      PHYSICAL EXAM:  Blood pressure 129/76, pulse 69, temperature 97.9 F (36.6 C), height 5' 1.5 (1.562 m), weight 209 lb (94.8 kg), last menstrual period 10/28/2013, SpO2 96%. Body mass index is 38.85 kg/m.  DIAGNOSTIC DATA REVIEWED BY MYSELF TODAY:  BMET    Component Value Date/Time   NA 136 10/01/2024 1022   K 5.0 10/01/2024 1022   CL 99 10/01/2024 1022   CO2 20 10/01/2024 1022   GLUCOSE 100 (H) 10/01/2024 1022   GLUCOSE 90 06/20/2024 1638   BUN 17 10/01/2024 1022   CREATININE 0.89 10/01/2024 1022   CREATININE 0.70 12/23/2013 0933   CALCIUM  10.5 (H) 10/01/2024 1022   GFRNONAA >89 12/23/2013 0933   GFRAA >89 12/23/2013 0933   Lab Results  Component Value Date   HGBA1C 5.4 10/01/2024   HGBA1C 5.6 12/09/2022  No results found for: INSULIN  Lab Results  Component Value Date   TSH 1.350 10/01/2024   CBC    Component Value Date/Time   WBC 6.6 10/01/2024 1022   WBC 9.4 07/11/2022 1344   RBC 5.86 (H) 10/01/2024 1022   RBC 5.24 (H) 07/11/2022 1344   HGB 16.2 (H) 10/01/2024 1022   HCT 49.8 (H) 10/01/2024 1022   PLT 247 10/01/2024 1022   MCV 85 10/01/2024 1022   MCH 27.6 10/01/2024 1022   MCH 27.3 12/23/2013 0933   MCHC 32.5 10/01/2024 1022   MCHC 33.8 07/11/2022 1344   RDW 13.1 10/01/2024  1022   Iron Studies No results found for: IRON, TIBC, FERRITIN, IRONPCTSAT Lipid Panel     Component Value Date/Time   CHOL 190 10/01/2024 1022   TRIG 152 (H) 10/01/2024 1022   HDL 55 10/01/2024 1022   CHOLHDL 3.5 10/01/2024 1022   CHOLHDL 3 10/30/2023 0945   VLDL 29.2 10/30/2023 0945   LDLCALC 108 (H) 10/01/2024 1022   LDLDIRECT 114.0 07/11/2022 1344   Hepatic Function Panel     Component Value Date/Time   PROT 7.3 10/01/2024 1022   ALBUMIN 4.9 10/01/2024 1022   AST 16 10/01/2024 1022   ALT 23 10/01/2024 1022   ALKPHOS 121 10/01/2024 1022   BILITOT 0.5 10/01/2024 1022      Component Value Date/Time   TSH 1.350 10/01/2024 1022   Nutritional Lab Results  Component Value Date   VD25OH 48.5 10/01/2024   VD25OH 47.27 06/20/2024   VD25OH 22.14 (L) 10/30/2023     Assessment and Plan Assessment & Plan Obesity Chronic obesity with weight gain post-childbirth, stress eating, and cravings for fatty foods and sweets. Previous weight loss attempts with Weight Watchers, Noom, and Atkins, with best results on Weight Watchers. Current exercise includes walking 2-3 times a week for 20-30 minutes. Desired weight is 140 pounds. Indirect calorimeter test shows appropriate resting energy expenditure and basal metabolic rate for age, height, and weight. Discussed body's mechanisms to resist weight loss and importance of nutrition. Emphasized food as medicine and need for structured eating plan. Discussed potential use of GLP-1 agonists, noting variability in effectiveness and potential health risks with misuse. - Initiated category two eating plan with specific meal options and snack calories. - Provided grocery list and food scale for portion control. - Encouraged adherence to eating plan for two weeks and will reassess. - Discussed potential use of GLP-1 agonists if needed in the future.  Obstructive sleep apnea Diagnosed with obstructive sleep apnea, confirmed by home study.  Currently using CPAP with ongoing adjustments for mask fit. Symptoms include snoring, morning headaches, and witnessed apneas. Elevated hemoglobin suggests uncontrolled sleep apnea. Discussed impact of untreated sleep apnea on heart health and metabolism, emphasizing importance of CPAP adherence. - Continue CPAP therapy with ongoing adjustments for mask fit. - Monitor hemoglobin levels to assess control of sleep apnea.  Mixed hyperlipidemia Elevated triglycerides at 152 and LDL at 108. Not currently on statin therapy. Discussed genetic predisposition to elevated cholesterol with age and importance of dietary management. Goal is to achieve control without medication if possible. - Implement dietary changes as part of the eating plan to manage cholesterol levels. - Will reassess lipid levels after dietary intervention.  Hyperglycemia Recent labs show elevated fasting glucose at 100 and hemoglobin A1c at 5.4. Discussed potential impact of hyperglycemia on weight gain and hunger signals. Further testing planned to assess underlying causes. - Ordered additional tests to evaluate hyperglycemia. - Will  reassess glucose levels and A1c at follow-up in two weeks. - Continue diet, exercise and weight loss as discussed today as an important part of the treatment plan   Hypertension Currently managed with lisinopril  20 mg daily. Blood pressure is well-controlled at 129/76. Discussed importance of maintaining blood pressure control in the context of obesity and sleep apnea. - Continue lisinopril  20 mg daily. - Continue diet, exercise and weight loss as discussed today as an important part of the treatment plan       Patients who are on anti-obesity medications are counseled on the importance of maintaining healthy lifestyle habits, including balanced nutrition, regular physical activity, and behavioral modifications,  Medication is an adjunct to, not a replacement for, lifestyle changes and that the  long-term success and weight maintenance depend on continued adherence to these strategies.   Emily Stephens was informed of the importance of frequent follow up visits to maximize her success with intensive lifestyle modifications for her obesity and obesity related health conditions as recommended by USPSTF and CMS guidelines  I personally spent a total of 40 minutes in the care of the patient today including preparing to see the patient, reviewing separately obtained history, performing a medically appropriate evaluation of current problems, placing orders in the EMR, documenting clinical information in the EMR, customized nutritional counseling for their specific health and social needs, referring as appropriate to other health care professionals, independently interpreting results, discussing results with the patient and educating them on how these results can affect their health and weight, and explaining the pathophysiology of obesity and how it is significantly more complex than eat less and exercise more.  Louann Penton, MD   "

## 2024-10-10 LAB — CMP14+EGFR
ALT: 19 IU/L (ref 0–32)
AST: 11 IU/L (ref 0–40)
Albumin: 4.6 g/dL (ref 3.9–4.9)
Alkaline Phosphatase: 116 IU/L (ref 49–135)
BUN/Creatinine Ratio: 20 (ref 12–28)
BUN: 16 mg/dL (ref 8–27)
Bilirubin Total: 0.5 mg/dL (ref 0.0–1.2)
CO2: 23 mmol/L (ref 20–29)
Calcium: 9.8 mg/dL (ref 8.7–10.3)
Chloride: 104 mmol/L (ref 96–106)
Creatinine, Ser: 0.81 mg/dL (ref 0.57–1.00)
Globulin, Total: 2.5 g/dL (ref 1.5–4.5)
Glucose: 90 mg/dL (ref 70–99)
Potassium: 5 mmol/L (ref 3.5–5.2)
Sodium: 142 mmol/L (ref 134–144)
Total Protein: 7.1 g/dL (ref 6.0–8.5)
eGFR: 82 mL/min/1.73

## 2024-10-10 LAB — INSULIN, RANDOM: INSULIN: 26.1 u[IU]/mL — ABNORMAL HIGH (ref 2.6–24.9)

## 2024-10-10 LAB — FOLATE: Folate: 12.6 ng/mL

## 2024-10-10 LAB — VITAMIN B12: Vitamin B-12: 703 pg/mL (ref 232–1245)

## 2024-10-10 NOTE — Telephone Encounter (Signed)
 Faxed back to [facility] at [phone]

## 2024-10-11 ENCOUNTER — Telehealth: Payer: Self-pay | Admitting: Internal Medicine

## 2024-10-11 NOTE — Telephone Encounter (Signed)
 Faxed to [facility] at [phone]. Fax confirmation received, [facility].

## 2024-10-11 NOTE — Telephone Encounter (Signed)
 PT is scheduled for a colonoscopy on 1/29 and has not received any instructions or prep medication. Requesting that it be placed on mychart.

## 2024-10-11 NOTE — Telephone Encounter (Signed)
 RN sent patient colon prep instructions to patient via My Chart, per patient request. RN left vm with patient informing her of this and requested a call back if patient has further questions.

## 2024-10-15 ENCOUNTER — Telehealth: Payer: Self-pay | Admitting: Internal Medicine

## 2024-10-15 NOTE — Telephone Encounter (Signed)
 Good afternoon Dr. Federico,   I received a call from this patient stating that she is needing to cancel her procedure for January the 29 th. Patient stated that she has a lot of family issues at this time going on and would feel better to just reschedule her procedure for February the 24 th. Please advise.   Thank you

## 2024-10-17 ENCOUNTER — Ambulatory Visit: Admitting: Family Medicine

## 2024-10-17 ENCOUNTER — Encounter: Payer: Self-pay | Admitting: Family Medicine

## 2024-10-17 ENCOUNTER — Encounter: Admitting: Internal Medicine

## 2024-10-17 VITALS — BP 105/69 | HR 81 | Ht 61.5 in | Wt 212.4 lb

## 2024-10-17 DIAGNOSIS — G4733 Obstructive sleep apnea (adult) (pediatric): Secondary | ICD-10-CM

## 2024-10-17 DIAGNOSIS — I10 Essential (primary) hypertension: Secondary | ICD-10-CM | POA: Diagnosis not present

## 2024-10-17 DIAGNOSIS — L659 Nonscarring hair loss, unspecified: Secondary | ICD-10-CM

## 2024-10-17 MED ORDER — SPIRONOLACTONE 25 MG PO TABS: 25.0000 mg | ORAL_TABLET | Freq: Every day | ORAL | 3 refills | Status: AC

## 2024-10-17 NOTE — Assessment & Plan Note (Signed)
 Advised her to schedule f/u with sleep specialist.

## 2024-10-17 NOTE — Progress Notes (Signed)
" ° ° °  Subjective   Patient ID: Emily Stephens, female    DOB: 12/19/1960  Age: 64 y.o. MRN: [id number]  Chief Complaint  Patient presents with   Medical Management of Chronic Issues     History of Present Illness   Emily Stephens is a 64 year old female with sleep apnea who presents for CPAP compliance follow-up and blood pressure management.  She is here for follow-up of CPAP compliance. Was told when she scheduled this appt that she could follow up regarding her cpap evaluation with me instead of the sleep medicine specialist.  I do not have access to her cpap data and advised her to schedule with the sleep medicine specialist.   She is also here for blood pressure management. She notes leg fluid retention and uses a home blood pressure monitor to track readings.  She is okay with switching from lisinoprili to spironolactone  if it may help with her alopecia.   She is concerned about hair loss. She has been using topical treatments and is reconsidering oral minoxidil . She previously had picked up the oral form but has not yet started it after reading about side effects.   She is worried about recent labs showing a newly elevated red blood cell count.  Discussed how this is likely related to sleep apnea.          The 10-year ASCVD risk score (Arnett DK, et al., 2019) is: 4.4%  Health Maintenance Due  Topic Date Due   HIV Screening  Never done   Cervical Cancer Screening (HPV/Pap Cotest)  09/03/2021   Colonoscopy  10/24/2023   COVID-19 Vaccine (6 - 2025-26 season) 05/20/2024      Objective:     BP 105/69   Pulse 81   Ht 5' 1.5 (1.562 m)   Wt 212 lb 6.4 oz (96.3 kg)   LMP 10/28/2013   SpO2 96%   BMI 39.48 kg/m    Physical Exam     Gen: alert, oriented Pulm: no respiratory distress Psych: pleasant affect       No results found for any visits on 10/17/24.      Assessment & Plan:   Essential hypertension Assessment & Plan: Lisinopril  discontinued.  Spironolactone  initiated for blood pressure control and possible hair loss benefit. - Discontinued lisinopril . - Initiated spironolactone  25 mg daily. - Instructed to monitor blood pressure at home. - Increase spironolactone  to 50 mg daily if blood pressure increases. - Schedule lab tests for kidney function and potassium levels 2-4 weeks after switching to spironolactone .  Orders: -     Basic metabolic panel with GFR; Future  Hair loss Assessment & Plan: Topical treatments continued. Oral finasteride considered but patient hesitant due to side effect concerns. Reassured about low-dose tolerability. - Consider resuming oral finasteride 1.25 mg daily, using a pill cutter to halve 2.5 mg tablets. - Continue topical treatments as previously used.   OSA (obstructive sleep apnea) Assessment & Plan: Advised her to schedule f/u with sleep specialist.    Other orders -     Spironolactone ; Take 1 tablet (25 mg total) by mouth daily.  Dispense: 90 tablet; Refill: 3        No follow-ups on file.    Toribio MARLA Slain, MD  "

## 2024-10-17 NOTE — Assessment & Plan Note (Signed)
 Topical treatments continued. Oral finasteride considered but patient hesitant due to side effect concerns. Reassured about low-dose tolerability. - Consider resuming oral finasteride 1.25 mg daily, using a pill cutter to halve 2.5 mg tablets. - Continue topical treatments as previously used.

## 2024-10-17 NOTE — Patient Instructions (Signed)
" ° °  YOUR PLAN: HYPERTENSION: Your blood pressure management was reviewed, and changes were made to your medication. -Discontinue lisinopril  due to fluid retention and potential hair loss. -Start taking spironolactone  25 mg daily. -Monitor your blood pressure at home regularly. -If your blood pressure increases, increase spironolactone  to 50 mg daily. -Schedule lab tests for kidney function and potassium levels 2-4 weeks after starting spironolactone .  FEMALE PATTERN HAIR LOSS: We discussed your hair loss and treatment options. -Consider resuming oral finasteride 1.25 mg daily by cutting 2.5 mg tablets in half. -Continue using your topical treatments as before.  SECONDARY POLYCYTHEMIA: Your elevated red blood cell count is likely due to sleep apnea. -Continue managing your sleep apnea.  CPAP COMPLIANCE: You need to follow up with the CPAP company to access your usage data for insurance purposes. -you will need to make an appointment with the sleep medicine specialist who you saw to get the information you need for your insurance company   "

## 2024-10-17 NOTE — Assessment & Plan Note (Signed)
 Lisinopril  discontinued. Spironolactone  initiated for blood pressure control and possible hair loss benefit. - Discontinued lisinopril . - Initiated spironolactone  25 mg daily. - Instructed to monitor blood pressure at home. - Increase spironolactone  to 50 mg daily if blood pressure increases. - Schedule lab tests for kidney function and potassium levels 2-4 weeks after switching to spironolactone .

## 2024-10-22 ENCOUNTER — Encounter: Payer: Self-pay | Admitting: Family Medicine

## 2024-10-22 ENCOUNTER — Ambulatory Visit

## 2024-10-22 ENCOUNTER — Ambulatory Visit: Admitting: Family Medicine

## 2024-10-22 ENCOUNTER — Ambulatory Visit: Payer: Self-pay

## 2024-10-22 VITALS — BP 107/72 | HR 108 | Temp 98.7°F | Ht 61.5 in | Wt 210.8 lb

## 2024-10-22 DIAGNOSIS — R6889 Other general symptoms and signs: Secondary | ICD-10-CM

## 2024-10-22 DIAGNOSIS — J111 Influenza due to unidentified influenza virus with other respiratory manifestations: Secondary | ICD-10-CM | POA: Diagnosis not present

## 2024-10-22 LAB — POC COVID19/FLU A&B COMBO
Covid Antigen, POC: NEGATIVE
Influenza A Antigen, POC: POSITIVE — AB
Influenza B Antigen, POC: NEGATIVE

## 2024-10-22 MED ORDER — HYDROCODONE BIT-HOMATROP MBR 5-1.5 MG/5ML PO SOLN: ORAL | 0 refills | Status: DC

## 2024-10-22 MED ORDER — OSELTAMIVIR PHOSPHATE 75 MG PO CAPS: 75.0000 mg | ORAL_CAPSULE | Freq: Two times a day (BID) | ORAL | 0 refills | Status: AC
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
## 2024-10-22 NOTE — Telephone Encounter (Signed)
 Contacted pt and appt scheduled for today

## 2024-10-23 ENCOUNTER — Ambulatory Visit: Admitting: Family Medicine

## 2024-10-23 ENCOUNTER — Ambulatory Visit (INDEPENDENT_AMBULATORY_CARE_PROVIDER_SITE_OTHER): Admitting: Family Medicine

## 2024-10-23 DIAGNOSIS — J111 Influenza due to unidentified influenza virus with other respiratory manifestations: Secondary | ICD-10-CM | POA: Insufficient documentation

## 2024-10-23 NOTE — Assessment & Plan Note (Signed)
 Positive for flu A.  - prescribed tamiflu  - Continue Mucinex  DM for cough and mucus relief. - Use Tylenol  and ibuprofen for pain relief. - Consider numbing lozenges or sprays for sore throat relief. - Use nasal saline spray for congestion, limit to 3-4 days. - Prescribed Hycodan for nighttime cough, caution against daytime use.

## 2024-10-24 ENCOUNTER — Ambulatory Visit: Admitting: Family Medicine

## 2024-10-25 ENCOUNTER — Ambulatory Visit

## 2024-10-25 VITALS — BP 118/68 | HR 84 | Temp 97.6°F | Ht 62.0 in | Wt 210.4 lb

## 2024-10-25 DIAGNOSIS — E66812 Obesity, class 2: Secondary | ICD-10-CM

## 2024-10-25 DIAGNOSIS — J301 Allergic rhinitis due to pollen: Secondary | ICD-10-CM

## 2024-10-25 DIAGNOSIS — G4733 Obstructive sleep apnea (adult) (pediatric): Secondary | ICD-10-CM

## 2024-10-25 NOTE — Progress Notes (Signed)
 "  Pulmonology Office Visit   Subjective:  Patient ID: Emily Stephens, female    DOB: [birth year]  MRN: [id number]  Referred by: Chandra Toribio POUR, MD  CC:  Chief Complaint  Patient presents with   Sleep Apnea    CPAP helping with daytime energy.  Hard to sleep with as mask is leaking air around edges.  Dx with influenza 10/22/2024.  Sx started 10/19/2024.    HPI Emily Stephens is a 64 y.o. female non-smoker with hypertension, allergic rhinitis, GERD, hyperlipidemia, obesity class II, depression/anxiety presents as a follow-up for excessive daytime sleepiness.  Seen 07/03/24> ESS 13 with s/s of OSA. HST ordered.  08/26/24> CPAP initiated 8-12.  Weight loss discussed with lifestyle modification.   Respective notes from provider reviewed as appropriate to gather relevant information for patient care.   Discussed the use of AI scribe software for clinical note transcription with the patient, who gave verbal consent to proceed.  History of Present Illness   Emily Stephens is a 64 year old female with obstructive sleep apnea who presents for a follow-up appointment for OSA management.  She was diagnosed with influenza last week and is currently taking Tamiflu , with one day of medication remaining. She feels tired and experiences body aches and headaches during the illness. She is also taking Mucinex  to manage her symptoms. Her husband has not contracted the flu, and she is unsure of the source of her infection.  She has been using a CPAP machine for obstructive sleep apnea since the beginning of the year, although she started using it consistently after Christmas. Initially, the mask was effective, but she has experienced issues with mask leakage, particularly when turning over in bed. She has tried different masks and chin straps without finding a satisfactory solution. Using the CPAP improves her energy levels and reduces nighttime awakenings, but she has not been using it consistently due to  the flu and mask issues. She cleans her CPAP equipment weekly, using a small amount of soap for the hoses and baby wipes for the mask. She uses distilled water in the chamber.        OSA history: HST November 2025> moderate OSA> CPAP initiated Dec 2025.    CPAP compliance: [facility] Jan-February 2026 Pressure 8-12 cm H2O [median 8.8, max 11.2.] AHI 1.8 No significant leak. 4-hour 36-minute 63% in last 30 days greater than 4 hours used.  PRIOR TESTS and IMAGING: HST 07/25/2024: AHI 4% 19, AHI 3% 31.4, O2 nadir 85, desaturation 7-minute.  Weight 215 pound.  Banding desaturation   echo 06/2023: EF 65%, grade 1 diastolic dysfunction      No data to display          Allergies: Patient has no known allergies.  Current Outpatient Medications:    dextromethorphan-guaiFENesin  (MUCINEX  DM) 30-600 MG 12hr tablet, Take 1 tablet by mouth 2 (two) times daily., Disp: , Rfl:    estradiol  (ESTRACE ) 0.01 % CREA vaginal cream, Apply 1 gram per vagina every night for 2 weeks, then apply three times a week, Disp: 30 g, Rfl: 12   Multiple Vitamin (MULTIVITAMIN) tablet, Take 1 tablet by mouth daily., Disp: , Rfl:    oseltamivir  (TAMIFLU ) 75 MG capsule, Take 1 capsule (75 mg total) by mouth 2 (two) times daily for 5 days., Disp: 10 capsule, Rfl: 0   spironolactone  (ALDACTONE ) 25 MG tablet, Take 1 tablet (25 mg total) by mouth daily., Disp: 90 tablet, Rfl: 3 Past Medical History:  Diagnosis Date   Acute  medial meniscus tear of left knee 12/08/2017   Allergy    seasonal allergies   Anxiety    Arthritis    bilateral knees   Bilateral lower extremity edema    Bone spur    Right Foot   Cataract    not a surgical candidate at this time (10/09/2020)   Depression    on meds   Dermatophytosis of nail    Essential hypertension, benign    on meds   GERD (gastroesophageal reflux disease)    Hyperlipidemia    diet controlled-no meds   Joint pain    Menorrhagia    Obesity    Osteoarthritis     Osteopenia    Other and unspecified hyperlipidemia    Pain in joint, ankle and foot    Sleep apnea    SOBOE (shortness of breath on exertion)    Unspecified vitamin D  deficiency    Vitamin D  deficiency    Past Surgical History:  Procedure Laterality Date   APPENDECTOMY     CATARACT EXTRACTION, BILATERAL Bilateral 2025   COLONOSCOPY  2012   WNL-Dr.Peters   KNEE ARTHROSCOPY Bilateral    X2, 02/2024- LEFT AND 06/2024-RIGHT   MENISCUS REPAIR Left 2019   TONSILLECTOMY     Family History  Problem Relation Age of Onset   Lung disease Mother    Osteoporosis Mother    Colon polyps Mother 22   Sudden death Mother    Sudden death Father    Hyperlipidemia Father    Hypertension Father    CVA Father    Stroke Father    Obesity Father    Aneurysm Sister    Heart disease Brother    Diabetes Maternal Aunt    Breast cancer Maternal Aunt    Diabetes Maternal Uncle    Breast cancer Cousin    Esophageal cancer Neg Hx    Colon cancer Neg Hx    Stomach cancer Neg Hx    Rectal cancer Neg Hx    Social History   Socioeconomic History   Marital status: Married    Spouse name: DENNIS   Number of children: 2   Years of education: 14+   Highest education level: Bachelor's degree (e.g., BA, AB, BS)  Occupational History   Occupation: TEACHER    Employer: OTHER  Tobacco Use   Smoking status: Never   Smokeless tobacco: Never  Vaping Use   Vaping status: Never Used  Substance and Sexual Activity   Alcohol use: Yes    Alcohol/week: 3.0 standard drinks of alcohol    Types: 3 Standard drinks or equivalent per week    Comment: occ   Drug use: No   Sexual activity: Not on file  Other Topics Concern   Not on file  Social History Narrative   Marital Status: Married [place])   Children:  Son Designer, Industrial/product)  Daughter Geophysical Data Processor)    Pets:  Dog    Living Situation: Lives with spouse and children    Occupation: Runner, 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    Education:  Engineer, Maintenance (it)  ([employer])    Alcohol Use:  Occasional   Drug Use:  None   Diet:  Regular   Exercise:  Walking   Hobbies: Gardening, Reading, Shopping              Social Drivers of Health   Tobacco Use: Low Risk (10/25/2024)   Patient History    Smoking Tobacco Use: Never    Smokeless Tobacco Use:  Never    Passive Exposure: Not on file  Financial Resource Strain: Low Risk (09/25/2024)   Overall Financial Resource Strain (CARDIA)    Difficulty of Paying Living Expenses: Not very hard  Food Insecurity: No Food Insecurity (09/25/2024)   Epic    Worried About Programme Researcher, Broadcasting/film/video in the Last Year: Never true    Ran Out of Food in the Last Year: Never true  Transportation Needs: No Transportation Needs (09/25/2024)   Epic    Lack of Transportation (Medical): No    Lack of Transportation (Non-Medical): No  Physical Activity: Insufficiently Active (09/25/2024)   Exercise Vital Sign    Days of Exercise per Week: 2 days    Minutes of Exercise per Session: 30 min  Stress: No Stress Concern Present (09/25/2024)   [employer] of Occupational Health - Occupational Stress Questionnaire    Feeling of Stress: Only a little  Social Connections: Unknown (09/25/2024)   Social Connection and Isolation Panel    Frequency of Communication with Friends and Family: More than three times a week    Frequency of Social Gatherings with Friends and Family: Once a week    Attends Religious Services: More than 4 times per year    Active Member of Clubs or Organizations: Patient declined    Attends Banker Meetings: Not on file    Marital Status: Married  Intimate Partner Violence: Not on file  Depression (PHQ2-9): Low Risk (10/17/2024)   Depression (PHQ2-9)    PHQ-2 Score: 0  Alcohol Screen: Low Risk (09/25/2024)   Alcohol Screen    Last Alcohol Screening Score (AUDIT): 1  Housing: Low Risk (09/25/2024)   Epic    Unable to Pay for Housing in the Last Year: No    Number of Times Moved in the Last Year: 0     Homeless in the Last Year: No  Utilities: Not on file  Health Literacy: Not on file       Objective:  BP 118/68   Pulse 84   Temp 97.6 F (36.4 C) (Temporal)   Ht 5' 2 (1.575 m)   Wt 210 lb 6.4 oz (95.4 kg)   LMP 10/28/2013   SpO2 97% Comment: room air  BMI 38.48 kg/m  BMI Readings from Last 3 Encounters:  10/25/24 38.48 kg/m  10/22/24 39.19 kg/m  10/17/24 39.48 kg/m    Physical Exam: Physical Exam   ENT: Normal mucosa.  PULMONARY: Lungs clear to auscultation bilaterally, no adventitious breath sounds. CARDIOVASCULAR: Regular rate and rhythm, S1 S2 normal, no murmurs. ABDOMEN: Abdomen soft, nontender. Bowel sounds are normal EXTREMITIES: No peripheral edema noted.       Diagnostic Review:  Last metabolic panel Lab Results  Component Value Date   GLUCOSE 90 10/09/2024   NA 142 10/09/2024   K 5.0 10/09/2024   CL 104 10/09/2024   CO2 23 10/09/2024   BUN 16 10/09/2024   CREATININE 0.81 10/09/2024   GFR 67.95 06/20/2024   CALCIUM  9.8 10/09/2024   PROT 7.1 10/09/2024   ALBUMIN 4.6 10/09/2024   LABGLOB 2.5 10/09/2024   BILITOT 0.5 10/09/2024   ALKPHOS 116 10/09/2024   AST 11 10/09/2024   ALT 19 10/09/2024         Assessment & Plan:   Assessment & Plan OSA (obstructive sleep apnea)     Obesity, class 2 Has been referred to weight management clinic.  Appointment on 11/07/2024.    Allergic rhinitis due to pollen, unspecified seasonality Discussed saline  spray and Flonase  for relief.        Assessment and Plan    Obstructive sleep apnea Managed with CPAP therapy. Issues with mask fit and leakage affecting use. Improved energy and reduced awakenings with effective use.  - Contact [facility] for hybrid mask with top hose. - Use chin strap with nasal mask if hybrid mask unavailable. - Consider Somnifix mouth tape with chin strap. - Avoid facial moisturizer to prevent leakage. - Clean CPAP, avoid soap in chamber.  Allergic rhinitis due to  pollen Managed with saline nasal spray. Congestion exacerbated by influenza. Flonase  deferred until post-recovery. - Continue saline nasal spray as needed. - Resume Flonase  post-influenza if congestion persists.       Notes from 07/03/24 by NP Cobb reviewed as to gather relevant information for patient care and formulating plan.   Return in about 1 year (around 10/25/2025).   I personally spent a total of in the care of the patient today including preparing to see the patient, getting/reviewing separately obtained history, performing a medically appropriate exam/evaluation, counseling and educating, placing orders, documenting clinical information in the EHR, independently interpreting results, and communicating results.   Sammi Fredericks, MD "

## 2024-10-25 NOTE — Patient Instructions (Signed)
" °  VISIT SUMMARY:  During your follow-up appointment, we discussed the management of your obstructive sleep apnea and the recent influenza diagnosis. We also addressed your allergic rhinitis symptoms.  YOUR PLAN:  -OBSTRUCTIVE SLEEP APNEA: Obstructive sleep apnea is a condition where your breathing stops and starts repeatedly during sleep due to blocked airways. We discussed the importance of using your CPAP machine consistently to improve your energy levels and reduce nighttime awakenings. To address the mask leakage issues, you should contact [facility] for a hybrid mask with a top hose. If that is not available, use a chin strap with your nasal mask or consider using Somnifix mouth tape with the chin strap. Avoid using facial moisturizer to prevent leakage, and clean your CPAP equipmentas discussed, avoiding soap in the chamber.  -INFLUENZA: Influenza is a viral infection that affects your respiratory system. You are currently taking Tamiflu  and have one day of medication remaining. Continue taking Mucinex  to manage your symptoms. Rest and stay hydrated to help your recovery.  -ALLERGIC RHINITIS DUE TO POLLEN: Allergic rhinitis is an allergic reaction that causes sneezing, congestion, and a runny nose. Your congestion has been worsened by the flu. Continue using saline nasal spray as needed, and you can resume using Flonase  after you have recovered from the flu if congestion persists.  INSTRUCTIONS:  Please follow up with us  if you continue to experience issues with your CPAP mask or if your symptoms do not improve. Maintain consistent use of your CPAP machine for insurance compliance. If your congestion persists after recovering from the flu, resume using Flonase . Contact us  if you have any further questions or concerns.    Contains text generated by [employer].   "

## 2024-10-25 NOTE — Assessment & Plan Note (Addendum)
 SABRA

## 2024-11-07 ENCOUNTER — Other Ambulatory Visit

## 2024-11-07 ENCOUNTER — Ambulatory Visit (INDEPENDENT_AMBULATORY_CARE_PROVIDER_SITE_OTHER): Admitting: Family Medicine

## 2024-11-12 ENCOUNTER — Encounter: Admitting: Internal Medicine

## 2024-11-12 ENCOUNTER — Ambulatory Visit

## 2024-12-02 ENCOUNTER — Ambulatory Visit

## 2025-01-24 ENCOUNTER — Ambulatory Visit: Admitting: Family Medicine
